# Patient Record
Sex: Female | Born: 1965 | Race: White | Hispanic: No | Smoking: Current every day smoker
Health system: Southern US, Community
[De-identification: ages and names within clinical notes are randomized; demographics above are authoritative.]

## PROBLEM LIST (undated history)

## (undated) DIAGNOSIS — R42 Dizziness and giddiness: Secondary | ICD-10-CM

## (undated) DIAGNOSIS — D649 Anemia, unspecified: Secondary | ICD-10-CM

## (undated) DIAGNOSIS — M549 Dorsalgia, unspecified: Secondary | ICD-10-CM

## (undated) DIAGNOSIS — K279 Peptic ulcer, site unspecified, unspecified as acute or chronic, without hemorrhage or perforation: Secondary | ICD-10-CM

## (undated) DIAGNOSIS — Z5189 Encounter for other specified aftercare: Secondary | ICD-10-CM

## (undated) DIAGNOSIS — R053 Chronic cough: Secondary | ICD-10-CM

## (undated) DIAGNOSIS — R0789 Other chest pain: Secondary | ICD-10-CM

## (undated) DIAGNOSIS — R05 Cough: Secondary | ICD-10-CM

## (undated) DIAGNOSIS — J40 Bronchitis, not specified as acute or chronic: Secondary | ICD-10-CM

## (undated) DIAGNOSIS — G8929 Other chronic pain: Secondary | ICD-10-CM

## (undated) HISTORY — PX: CHOLECYSTECTOMY: SHX55

## (undated) HISTORY — PX: ABDOMINAL HYSTERECTOMY: SHX81

## (undated) HISTORY — PX: OTHER SURGICAL HISTORY: SHX169

## (undated) HISTORY — PX: TUBAL LIGATION: SHX77

---

## 2001-10-20 ENCOUNTER — Emergency Department (HOSPITAL_COMMUNITY): Admission: EM | Admit: 2001-10-20 | Discharge: 2001-10-20 | Payer: Self-pay | Admitting: Internal Medicine

## 2001-11-28 ENCOUNTER — Ambulatory Visit (HOSPITAL_COMMUNITY): Admission: RE | Admit: 2001-11-28 | Discharge: 2001-11-28 | Payer: Self-pay | Admitting: Orthopaedic Surgery

## 2001-11-28 ENCOUNTER — Encounter: Payer: Self-pay | Admitting: Orthopaedic Surgery

## 2001-12-09 ENCOUNTER — Ambulatory Visit (HOSPITAL_COMMUNITY): Admission: RE | Admit: 2001-12-09 | Discharge: 2001-12-09 | Payer: Self-pay | Admitting: Orthopaedic Surgery

## 2001-12-09 ENCOUNTER — Encounter: Payer: Self-pay | Admitting: Orthopaedic Surgery

## 2002-06-06 ENCOUNTER — Emergency Department (HOSPITAL_COMMUNITY): Admission: EM | Admit: 2002-06-06 | Discharge: 2002-06-06 | Payer: Self-pay | Admitting: Emergency Medicine

## 2002-06-06 ENCOUNTER — Encounter: Payer: Self-pay | Admitting: Emergency Medicine

## 2002-07-10 ENCOUNTER — Emergency Department (HOSPITAL_COMMUNITY): Admission: EM | Admit: 2002-07-10 | Discharge: 2002-07-10 | Payer: Self-pay

## 2002-07-15 ENCOUNTER — Emergency Department (HOSPITAL_COMMUNITY): Admission: EM | Admit: 2002-07-15 | Discharge: 2002-07-15 | Payer: Self-pay | Admitting: *Deleted

## 2003-03-04 ENCOUNTER — Emergency Department (HOSPITAL_COMMUNITY): Admission: EM | Admit: 2003-03-04 | Discharge: 2003-03-04 | Payer: Self-pay | Admitting: Internal Medicine

## 2003-04-29 ENCOUNTER — Emergency Department (HOSPITAL_COMMUNITY): Admission: EM | Admit: 2003-04-29 | Discharge: 2003-04-29 | Payer: Self-pay | Admitting: Internal Medicine

## 2003-09-20 ENCOUNTER — Emergency Department (HOSPITAL_COMMUNITY): Admission: EM | Admit: 2003-09-20 | Discharge: 2003-09-20 | Payer: Self-pay | Admitting: Emergency Medicine

## 2003-12-14 ENCOUNTER — Emergency Department (HOSPITAL_COMMUNITY): Admission: EM | Admit: 2003-12-14 | Discharge: 2003-12-14 | Payer: Self-pay | Admitting: Emergency Medicine

## 2004-02-05 ENCOUNTER — Emergency Department (HOSPITAL_COMMUNITY): Admission: EM | Admit: 2004-02-05 | Discharge: 2004-02-05 | Payer: Self-pay | Admitting: Emergency Medicine

## 2005-05-30 ENCOUNTER — Emergency Department (HOSPITAL_COMMUNITY): Admission: EM | Admit: 2005-05-30 | Discharge: 2005-05-30 | Payer: Self-pay | Admitting: Emergency Medicine

## 2008-06-12 ENCOUNTER — Emergency Department (HOSPITAL_COMMUNITY): Admission: EM | Admit: 2008-06-12 | Discharge: 2008-06-12 | Payer: Self-pay | Admitting: Emergency Medicine

## 2008-06-26 ENCOUNTER — Emergency Department (HOSPITAL_COMMUNITY): Admission: EM | Admit: 2008-06-26 | Discharge: 2008-06-26 | Payer: Self-pay | Admitting: Emergency Medicine

## 2008-07-12 ENCOUNTER — Emergency Department (HOSPITAL_COMMUNITY): Admission: EM | Admit: 2008-07-12 | Discharge: 2008-07-12 | Payer: Self-pay | Admitting: Emergency Medicine

## 2008-10-15 ENCOUNTER — Emergency Department (HOSPITAL_COMMUNITY): Admission: EM | Admit: 2008-10-15 | Discharge: 2008-10-15 | Payer: Self-pay | Admitting: Emergency Medicine

## 2009-02-01 ENCOUNTER — Emergency Department (HOSPITAL_COMMUNITY): Admission: EM | Admit: 2009-02-01 | Discharge: 2009-02-01 | Payer: Self-pay | Admitting: Emergency Medicine

## 2009-05-16 ENCOUNTER — Emergency Department (HOSPITAL_COMMUNITY): Admission: EM | Admit: 2009-05-16 | Discharge: 2009-05-16 | Payer: Self-pay | Admitting: Emergency Medicine

## 2009-07-16 ENCOUNTER — Emergency Department (HOSPITAL_COMMUNITY): Admission: EM | Admit: 2009-07-16 | Discharge: 2009-07-16 | Payer: Self-pay | Admitting: Emergency Medicine

## 2009-08-29 ENCOUNTER — Emergency Department (HOSPITAL_COMMUNITY): Admission: EM | Admit: 2009-08-29 | Discharge: 2009-08-29 | Payer: Self-pay | Admitting: Emergency Medicine

## 2009-12-19 ENCOUNTER — Emergency Department (HOSPITAL_COMMUNITY): Admission: EM | Admit: 2009-12-19 | Discharge: 2009-12-19 | Payer: Self-pay | Admitting: Emergency Medicine

## 2010-01-25 ENCOUNTER — Emergency Department (HOSPITAL_COMMUNITY): Admission: EM | Admit: 2010-01-25 | Discharge: 2010-01-25 | Payer: Self-pay | Admitting: Emergency Medicine

## 2010-02-07 ENCOUNTER — Emergency Department (HOSPITAL_COMMUNITY): Admission: EM | Admit: 2010-02-07 | Discharge: 2010-02-07 | Payer: Self-pay | Admitting: Emergency Medicine

## 2010-03-07 ENCOUNTER — Emergency Department (HOSPITAL_COMMUNITY): Admission: EM | Admit: 2010-03-07 | Discharge: 2010-03-07 | Payer: Self-pay | Admitting: Emergency Medicine

## 2010-03-30 ENCOUNTER — Emergency Department (HOSPITAL_COMMUNITY): Admission: EM | Admit: 2010-03-30 | Discharge: 2010-03-30 | Payer: Self-pay | Admitting: Emergency Medicine

## 2010-04-11 ENCOUNTER — Emergency Department (HOSPITAL_COMMUNITY): Admission: EM | Admit: 2010-04-11 | Discharge: 2010-04-11 | Payer: Self-pay | Admitting: Emergency Medicine

## 2010-11-03 ENCOUNTER — Emergency Department (HOSPITAL_COMMUNITY)
Admission: EM | Admit: 2010-11-03 | Discharge: 2010-11-03 | Payer: Self-pay | Source: Home / Self Care | Admitting: Emergency Medicine

## 2010-11-20 ENCOUNTER — Emergency Department (HOSPITAL_COMMUNITY)
Admission: EM | Admit: 2010-11-20 | Discharge: 2010-11-20 | Payer: Self-pay | Source: Home / Self Care | Admitting: Emergency Medicine

## 2010-12-25 ENCOUNTER — Emergency Department (HOSPITAL_COMMUNITY)
Admission: EM | Admit: 2010-12-25 | Discharge: 2010-12-25 | Disposition: A | Discharge status: Home / Self Care | Payer: Medicaid Other | Attending: Emergency Medicine | Admitting: Emergency Medicine

## 2010-12-25 ENCOUNTER — Emergency Department (HOSPITAL_COMMUNITY): Payer: Medicaid Other

## 2010-12-25 DIAGNOSIS — R51 Headache: Secondary | ICD-10-CM | POA: Insufficient documentation

## 2010-12-25 DIAGNOSIS — M549 Dorsalgia, unspecified: Secondary | ICD-10-CM | POA: Insufficient documentation

## 2010-12-25 DIAGNOSIS — M542 Cervicalgia: Secondary | ICD-10-CM | POA: Insufficient documentation

## 2010-12-25 DIAGNOSIS — G8929 Other chronic pain: Secondary | ICD-10-CM | POA: Insufficient documentation

## 2011-01-17 ENCOUNTER — Emergency Department (HOSPITAL_COMMUNITY)
Admission: EM | Admit: 2011-01-17 | Discharge: 2011-01-17 | Disposition: A | Discharge status: Home / Self Care | Payer: Medicaid Other | Attending: Emergency Medicine | Admitting: Emergency Medicine

## 2011-01-17 DIAGNOSIS — R911 Solitary pulmonary nodule: Secondary | ICD-10-CM | POA: Insufficient documentation

## 2011-01-17 DIAGNOSIS — R109 Unspecified abdominal pain: Secondary | ICD-10-CM | POA: Insufficient documentation

## 2011-01-17 LAB — URINALYSIS, ROUTINE W REFLEX MICROSCOPIC
Hgb urine dipstick: NEGATIVE
Protein, ur: NEGATIVE mg/dL
Specific Gravity, Urine: 1.015 (ref 1.005–1.030)
Urine Glucose, Fasting: NEGATIVE mg/dL
Urobilinogen, UA: 0.2 mg/dL (ref 0.0–1.0)
pH: 5 (ref 5.0–8.0)

## 2011-02-06 LAB — URINALYSIS, ROUTINE W REFLEX MICROSCOPIC
Bilirubin Urine: NEGATIVE
Glucose, UA: NEGATIVE mg/dL
Ketones, ur: NEGATIVE mg/dL
Protein, ur: NEGATIVE mg/dL
Specific Gravity, Urine: >1.03 — ABNORMAL HIGH (ref 1.005–1.030)
Urobilinogen, UA: 0.2 mg/dL (ref 0.0–1.0)
pH: 5 (ref 5.0–8.0)

## 2011-02-06 LAB — URINE MICROSCOPIC-ADD ON

## 2011-02-06 LAB — WET PREP, GENITAL: Yeast Wet Prep HPF POC: NONE SEEN

## 2011-02-07 LAB — URINALYSIS, ROUTINE W REFLEX MICROSCOPIC
Bilirubin Urine: NEGATIVE
Bilirubin Urine: NEGATIVE
Glucose, UA: NEGATIVE mg/dL
Hgb urine dipstick: NEGATIVE
Ketones, ur: NEGATIVE mg/dL
Nitrite: NEGATIVE
Protein, ur: NEGATIVE mg/dL
Specific Gravity, Urine: 1.02 (ref 1.005–1.030)
pH: 5.5 (ref 5.0–8.0)

## 2011-02-07 LAB — BASIC METABOLIC PANEL
CO2: 25 mEq/L (ref 19–32)
Calcium: 9.5 mg/dL (ref 8.4–10.5)
Chloride: 109 mEq/L (ref 96–112)
GFR calc non Af Amer: >60 mL/min (ref >60)
Glucose, Bld: 94 mg/dL (ref 70–99)
Potassium: 4.3 mEq/L (ref 3.5–5.1)
Sodium: 141 mEq/L (ref 135–145)

## 2011-02-07 LAB — URINE MICROSCOPIC-ADD ON

## 2011-02-12 LAB — COMPREHENSIVE METABOLIC PANEL
AST: 28 U/L (ref 0–37)
Albumin: 3.7 g/dL (ref 3.5–5.2)
Alkaline Phosphatase: 126 U/L — ABNORMAL HIGH (ref 39–117)
BUN: 11 mg/dL (ref 6–23)
CO2: 24 mEq/L (ref 19–32)
Chloride: 110 mEq/L (ref 96–112)
Creatinine, Ser: 0.64 mg/dL (ref 0.4–1.2)
GFR calc Af Amer: >60 mL/min (ref >60)
GFR calc non Af Amer: >60 mL/min (ref >60)
Potassium: 4 mEq/L (ref 3.5–5.1)
Sodium: 141 mEq/L (ref 135–145)
Total Bilirubin: 0.2 mg/dL — ABNORMAL LOW (ref 0.3–1.2)
Total Protein: 6.6 g/dL (ref 6.0–8.3)

## 2011-02-12 LAB — DIFFERENTIAL
Basophils Absolute: 0.1 10*3/uL (ref 0.0–0.1)
Lymphs Abs: 2.6 10*3/uL (ref 0.7–4.0)
Monocytes Relative: 6 % (ref 3–12)
Neutrophils Relative %: 55 % (ref 43–77)

## 2011-02-12 LAB — URINALYSIS, ROUTINE W REFLEX MICROSCOPIC
Bilirubin Urine: NEGATIVE
Glucose, UA: NEGATIVE mg/dL
Protein, ur: NEGATIVE mg/dL
Specific Gravity, Urine: 1.015 (ref 1.005–1.030)
Urobilinogen, UA: 0.2 mg/dL (ref 0.0–1.0)
pH: 5.5 (ref 5.0–8.0)

## 2011-02-12 LAB — CBC
HCT: 31.8 % — ABNORMAL LOW (ref 36.0–46.0)
Hemoglobin: 10.5 g/dL — ABNORMAL LOW (ref 12.0–15.0)
MCHC: 33 g/dL (ref 30.0–36.0)
Platelets: 259 10*3/uL (ref 150–400)
RBC: 3.74 MIL/uL — ABNORMAL LOW (ref 3.87–5.11)

## 2011-02-12 LAB — LIPASE, BLOOD: Lipase: 32 U/L (ref 11–59)

## 2011-02-19 ENCOUNTER — Emergency Department (HOSPITAL_COMMUNITY)
Admission: EM | Admit: 2011-02-19 | Discharge: 2011-02-19 | Disposition: A | Discharge status: Home / Self Care | Payer: Medicaid Other | Attending: Emergency Medicine | Admitting: Emergency Medicine

## 2011-02-19 ENCOUNTER — Emergency Department (HOSPITAL_COMMUNITY): Payer: Medicaid Other

## 2011-02-19 DIAGNOSIS — R1031 Right lower quadrant pain: Secondary | ICD-10-CM | POA: Insufficient documentation

## 2011-02-19 DIAGNOSIS — M545 Low back pain, unspecified: Secondary | ICD-10-CM | POA: Insufficient documentation

## 2011-02-19 DIAGNOSIS — R748 Abnormal levels of other serum enzymes: Secondary | ICD-10-CM | POA: Insufficient documentation

## 2011-02-19 DIAGNOSIS — D649 Anemia, unspecified: Secondary | ICD-10-CM | POA: Insufficient documentation

## 2011-02-19 LAB — DIFFERENTIAL
Eosinophils Absolute: 0 10*3/uL (ref 0.0–0.7)
Eosinophils Relative: 1 % (ref 0–5)
Lymphs Abs: 1.3 10*3/uL (ref 0.7–4.0)
Monocytes Relative: 8 % (ref 3–12)
Neutrophils Relative %: 60 % (ref 43–77)

## 2011-02-19 LAB — URINALYSIS, ROUTINE W REFLEX MICROSCOPIC
Bilirubin Urine: NEGATIVE
Hgb urine dipstick: NEGATIVE
Nitrite: NEGATIVE
pH: 5.5 (ref 5.0–8.0)

## 2011-02-19 LAB — CBC
HCT: 30.3 % — ABNORMAL LOW (ref 36.0–46.0)
MCH: 23.8 pg — ABNORMAL LOW (ref 26.0–34.0)
MCV: 78.5 fL (ref 78.0–100.0)
Platelets: 321 10*3/uL (ref 150–400)
RBC: 3.86 MIL/uL — ABNORMAL LOW (ref 3.87–5.11)

## 2011-02-19 LAB — COMPREHENSIVE METABOLIC PANEL
ALT: 44 U/L — ABNORMAL HIGH (ref 0–35)
Alkaline Phosphatase: 95 U/L (ref 39–117)
BUN: 10 mg/dL (ref 6–23)
CO2: 22 mEq/L (ref 19–32)
Chloride: 108 mEq/L (ref 96–112)
GFR calc non Af Amer: >60 mL/min (ref >60)
Glucose, Bld: 88 mg/dL (ref 70–99)
Potassium: 4.3 mEq/L (ref 3.5–5.1)
Total Bilirubin: 0.3 mg/dL (ref 0.3–1.2)
Total Protein: 6.4 g/dL (ref 6.0–8.3)

## 2011-02-23 LAB — URINALYSIS, ROUTINE W REFLEX MICROSCOPIC
Ketones, ur: NEGATIVE mg/dL
Protein, ur: NEGATIVE mg/dL
Urobilinogen, UA: 0.2 mg/dL (ref 0.0–1.0)

## 2011-02-25 LAB — WET PREP, GENITAL

## 2011-02-25 LAB — URINE CULTURE: Culture: NO GROWTH

## 2011-02-25 LAB — URINALYSIS, ROUTINE W REFLEX MICROSCOPIC
Bilirubin Urine: NEGATIVE
Ketones, ur: NEGATIVE mg/dL
Nitrite: NEGATIVE
Protein, ur: NEGATIVE mg/dL

## 2011-02-25 LAB — URINE MICROSCOPIC-ADD ON

## 2011-02-25 LAB — GC/CHLAMYDIA PROBE AMP, GENITAL: Chlamydia, DNA Probe: NEGATIVE

## 2011-02-27 LAB — URINE MICROSCOPIC-ADD ON

## 2011-02-27 LAB — URINALYSIS, ROUTINE W REFLEX MICROSCOPIC
Bilirubin Urine: NEGATIVE
Nitrite: NEGATIVE
Specific Gravity, Urine: 1.01 (ref 1.005–1.030)
Urobilinogen, UA: 0.2 mg/dL (ref 0.0–1.0)
pH: 5.5 (ref 5.0–8.0)

## 2011-02-27 LAB — URINE CULTURE

## 2011-05-08 ENCOUNTER — Emergency Department (HOSPITAL_COMMUNITY): Payer: Medicaid Other

## 2011-05-08 ENCOUNTER — Emergency Department (HOSPITAL_COMMUNITY)
Admission: EM | Admit: 2011-05-08 | Discharge: 2011-05-08 | Disposition: A | Discharge status: Home / Self Care | Payer: Medicaid Other | Attending: Emergency Medicine | Admitting: Emergency Medicine

## 2011-05-08 DIAGNOSIS — M542 Cervicalgia: Secondary | ICD-10-CM | POA: Insufficient documentation

## 2011-05-08 DIAGNOSIS — W108XXA Fall (on) (from) other stairs and steps, initial encounter: Secondary | ICD-10-CM | POA: Insufficient documentation

## 2011-05-08 DIAGNOSIS — G8929 Other chronic pain: Secondary | ICD-10-CM | POA: Insufficient documentation

## 2011-05-08 DIAGNOSIS — M549 Dorsalgia, unspecified: Secondary | ICD-10-CM | POA: Insufficient documentation

## 2011-10-02 ENCOUNTER — Emergency Department (HOSPITAL_COMMUNITY)
Admission: EM | Admit: 2011-10-02 | Discharge: 2011-10-03 | Disposition: A | Discharge status: Home / Self Care | Payer: Medicaid Other | Attending: Emergency Medicine | Admitting: Emergency Medicine

## 2011-10-02 DIAGNOSIS — S1093XA Contusion of unspecified part of neck, initial encounter: Secondary | ICD-10-CM | POA: Insufficient documentation

## 2011-10-02 DIAGNOSIS — S63501A Unspecified sprain of right wrist, initial encounter: Secondary | ICD-10-CM

## 2011-10-02 DIAGNOSIS — S0003XA Contusion of scalp, initial encounter: Secondary | ICD-10-CM | POA: Insufficient documentation

## 2011-10-02 DIAGNOSIS — W010XXA Fall on same level from slipping, tripping and stumbling without subsequent striking against object, initial encounter: Secondary | ICD-10-CM | POA: Insufficient documentation

## 2011-10-02 DIAGNOSIS — S0093XA Contusion of unspecified part of head, initial encounter: Secondary | ICD-10-CM

## 2011-10-02 DIAGNOSIS — F17200 Nicotine dependence, unspecified, uncomplicated: Secondary | ICD-10-CM | POA: Insufficient documentation

## 2011-10-02 DIAGNOSIS — S63509A Unspecified sprain of unspecified wrist, initial encounter: Secondary | ICD-10-CM | POA: Insufficient documentation

## 2011-10-02 DIAGNOSIS — W19XXXA Unspecified fall, initial encounter: Secondary | ICD-10-CM

## 2011-10-02 NOTE — ED Provider Notes (Signed)
History   This chart was scribed for Joanna Hutching, MD by Clarita Crane. The patient was seen in room APA03/APA03 and the patient's care was started at 11:23PM.   CSN: [id number] Arrival date & time: 10/02/2011 10:27 PM   First MD Initiated Contact with Patient 10/02/11 2312      Chief Complaint  Patient presents with  . Fall  . Facial Laceration   HPI Joanna Stephens is a 45 y.o. female who presents to the Emergency Department complaining of mild laceration/gouge to midline of forehead sustained several hours ago after falling while walking home with associated mild to moderate HA. Patient admits to having consumed EtOH (2 beers) prior to sustaining laceration with fall. Denies LOC, syncope, vision change. Patient also c/o right wrist pain to dorsal aspect of wrist sustained last night following a fall and persistent since. Patient reports she was evaluated in ED this morning for right wrist pain with negative imaging results.   History reviewed. No pertinent past medical history.  Past Surgical History  Procedure Date  . Abdominal hysterectomy     No family history on file.  History  Substance Use Topics  . Smoking status: Current Everyday Smoker  . Smokeless tobacco: Not on file  . Alcohol Use: Yes    OB History    Grav Para Term Preterm Abortions TAB SAB Ect Mult Living                  Review of Systems 10 Systems reviewed and are negative for acute change except as noted in the HPI.  Allergies  Review of patient's allergies indicates no known allergies.  Home Medications  No current outpatient prescriptions on file.  BP 109/53  Pulse 94  Temp(Src) 98.5 F (36.9 C) (Oral)  Resp 15  Ht 5\' 3"  (1.6 m)  Wt 110 lb (49.896 kg)  BMI 19.49 kg/m2  SpO2 98%  Physical Exam  Nursing note and vitals reviewed. Constitutional: She is oriented to person, place, and time. She appears well-developed and well-nourished. No distress.  HENT:  Head: Normocephalic and  atraumatic.       .5cm gouge to midline of forehead.   Eyes: EOM are normal. Pupils are equal, round, and reactive to light.  Neck: Neck supple. No tracheal deviation present.  Cardiovascular: Normal rate.   Pulmonary/Chest: Effort normal. No respiratory distress.  Abdominal: She exhibits no distension.  Musculoskeletal: Normal range of motion. She exhibits no edema.       Tenderness to dorsal aspect of right wrist.  Neurological: She is alert and oriented to person, place, and time. No sensory deficit.  Skin: Skin is warm and dry.  Psychiatric: She has a normal mood and affect. Her behavior is normal.    ED Course  Procedures (including critical care time)  DIAGNOSTIC STUDIES: Oxygen Saturation is 98% on room air, normal by my interpretation.    COORDINATION OF CARE:    Labs Reviewed - No data to display No results found.   No diagnosis found.  Results for orders placed during the hospital encounter of 02/19/11  DIFFERENTIAL      Component Value Range   Neutrophils Relative 60  43 - 77 (%)   Neutro Abs 2.5  1.7 - 7.7 (K/uL)   Lymphocytes Relative 31  12 - 46 (%)   Lymphs Abs 1.3  0.7 - 4.0 (K/uL)   Monocytes Relative 8  3 - 12 (%)   Monocytes Absolute 0.3  0.1 - 1.0 (K/uL)  Eosinophils Relative 1  0 - 5 (%)   Eosinophils Absolute 0.0  0.0 - 0.7 (K/uL)   Basophils Relative 1  0 - 1 (%)   Basophils Absolute 0.0  0.0 - 0.1 (K/uL)  CBC      Component Value Range   WBC 4.2  4.0 - 10.5 (K/uL)   RBC 3.86 (*) 3.87 - 5.11 (MIL/uL)   Hemoglobin 9.2 (*) 12.0 - 15.0 (g/dL)   HCT 56.2 (*) 13.0 - 46.0 (%)   MCV 78.5  78.0 - 100.0 (fL)   MCH 23.8 (*) 26.0 - 34.0 (pg)   MCHC 30.4  30.0 - 36.0 (g/dL)   RDW 86.5 (*) 78.4 - 15.5 (%)   Platelets 321  150 - 400 (K/uL)  URINALYSIS, ROUTINE W REFLEX MICROSCOPIC      Component Value Range   Color, Urine YELLOW  YELLOW    Appearance HAZY (*) CLEAR    Specific Gravity, Urine >1.030 (*) 1.005 - 1.030    pH 5.5  5.0 - 8.0     Glucose, UA NEGATIVE  NEGATIVE (mg/dL)   Hgb urine dipstick NEGATIVE  NEGATIVE    Bilirubin Urine NEGATIVE  NEGATIVE    Ketones, ur TRACE (*) NEGATIVE (mg/dL)   Protein, ur NEGATIVE  NEGATIVE (mg/dL)   Urobilinogen, UA 0.2  0.0 - 1.0 (mg/dL)   Nitrite NEGATIVE  NEGATIVE    Leukocytes, UA    NEGATIVE    Value: NEGATIVE MICROSCOPIC NOT DONE ON URINES WITH NEGATIVE PROTEIN, BLOOD, LEUKOCYTES, NITRITE, OR GLUCOSE <1000 mg/dL.  COMPREHENSIVE METABOLIC PANEL      Component Value Range   Sodium 137  135 - 145 (mEq/L)   Potassium 4.3  3.5 - 5.1 (mEq/L)   Chloride 108  96 - 112 (mEq/L)   CO2 22  19 - 32 (mEq/L)   Glucose, Bld 88  70 - 99 (mg/dL)   BUN 10  6 - 23 (mg/dL)   Creatinine, Ser 6.96  0.4 - 1.2 (mg/dL)   Calcium 9.3  8.4 - 29.5 (mg/dL)   Total Protein 6.4  6.0 - 8.3 (g/dL)   Albumin 3.8  3.5 - 5.2 (g/dL)   AST 284 (*) 0 - 37 (U/L)   ALT 44 (*) 0 - 35 (U/L)   Alkaline Phosphatase 95  39 - 117 (U/L)   Total Bilirubin 0.3  0.3 - 1.2 (mg/dL)   GFR calc non Af Amer >60  >60 (mL/min)   GFR calc Af Amer    >60 (mL/min)   Value: >60            The eGFR has been calculated     using the MDRD equation.     This calculation has not been     validated in all clinical     situations.     eGFR's persistently     <60 mL/min signify     possible Chronic Kidney Disease.    MDM  Patient had accidental fall.  No loss of consciousness.  Superficial scrape on for head. X. ray of right wrist and CT head negative.  No neuro deficits     I personally performed the services described in this documentation, which was scribed in my presence. The recorded information has been reviewed and considered.    Joanna Hutching, MD 10/03/11 [phone]

## 2011-10-02 NOTE — ED Notes (Signed)
Pt walking home tonight and fell onto her forehead. Pt smells of ETOH. Laceration to forehead present. C/o headache. Bleeding controlled. Pt recently seen yesterday at [facility] for fall and was told "my wrist is sprained" pt alert and oriented at this time. Denies loc at time of fall.

## 2011-10-02 NOTE — ED Notes (Signed)
Into room to assess patient. Patient resting in bed on back. Is alert and oriented x 4. Denies losing consciousness when she fell on pavement this evening. Pupils 4 mm bilaterally, sluggish and glazed look. Admits to drinking this evening. Small button size abrasion to upper middle forehead. States she has 10\10 headache, aching in nature at site. Denies any fuzzy vision or blindness. Bleeding controlled at site. Denies any neck pain. Equal chest rise and fall. Clear lung sounds. Ace wrap to right wrist from previous injury yesterday. Denies any needs at this time. Call bell within reach. Awaiting MD evaluation.

## 2011-10-02 NOTE — ED Notes (Signed)
MD at bedside to evaluate. Remains resting in bed on back. No distress. Denies needs. Equal chest rise and fall. Call bell within reach. Bed in low position and locked with side rails up.

## 2011-10-03 ENCOUNTER — Emergency Department (HOSPITAL_COMMUNITY): Payer: Medicaid Other

## 2011-10-03 NOTE — ED Notes (Signed)
Patient unable to find ride home. Keeps falling asleep when attempting to call for a ride. Consulted with charge nurse. Will allow patient to sleep off etoh and find ride in morning.

## 2011-10-03 NOTE — ED Notes (Signed)
Remains resting in bed on left side with eyes closed and lights off. No distress. Equal chest rise and fall. Call bell within reach. Bed in low position and locked. Will continue to monitor.

## 2011-10-03 NOTE — ED Notes (Signed)
Resting with eyes closed and lights off. Resting on left side. No distress. Equal chest rise and fall. Call bell within reach. Will continue to monitor.

## 2011-10-03 NOTE — ED Notes (Signed)
Remains resting with eyes closed and lights off. Equal chest rise and fall. No distress. Breathing 20x\minute, regular, nonlabored. Call bell within reach. Will continue to monitor.

## 2011-10-03 NOTE — ED Notes (Signed)
Resting with eyes closed and lights off. Equal chest rise and fall at 16x\minute, regular and equal. No distress. Will continue to monitor.

## 2011-10-03 NOTE — ED Notes (Signed)
Patient to CT via stretcher.

## 2011-10-03 NOTE — ED Notes (Signed)
Remains resting comfortably. Call bell within reach. Equal chest rise and fall. No distress.

## 2011-10-03 NOTE — ED Notes (Signed)
Remains resting in bed on back. Eyes closed. No facial grimaces. Equal chest rise and fall. No distress. Call bell within reach. Bed in low position and locked.

## 2011-10-03 NOTE — ED Notes (Signed)
Splint applied to right wrist. Tolerated well. Able to wiggle fingers. Brisk cap refill. Patient calling for ride home at this time.

## 2011-10-03 NOTE — ED Notes (Signed)
Patient awakened. Alert and oriented x 4. Given phone to call for a ride. Explained discharge papers. Verbalized understanding.

## 2011-10-03 NOTE — ED Notes (Signed)
Patient resting with eyes closed and lights off. Equal chest rise and fall. No distress. Bed in low position and locked with side rails up. Will continue to monitor.

## 2011-10-03 NOTE — ED Notes (Signed)
MD at bedside. 

## 2011-10-03 NOTE — ED Notes (Signed)
Remains resting with eyes closed. No distress. Equal chest rise and fall. Call bell within reach. Bed in low position and locked. No facial grimacing.

## 2012-03-29 ENCOUNTER — Encounter (HOSPITAL_COMMUNITY): Payer: Self-pay | Admitting: Emergency Medicine

## 2012-03-29 ENCOUNTER — Emergency Department (HOSPITAL_COMMUNITY)
Admission: EM | Admit: 2012-03-29 | Discharge: 2012-03-29 | Disposition: A | Discharge status: Home / Self Care | Payer: Medicaid Other | Attending: Emergency Medicine | Admitting: Emergency Medicine

## 2012-03-29 DIAGNOSIS — X58XXXA Exposure to other specified factors, initial encounter: Secondary | ICD-10-CM | POA: Insufficient documentation

## 2012-03-29 DIAGNOSIS — S39012A Strain of muscle, fascia and tendon of lower back, initial encounter: Secondary | ICD-10-CM

## 2012-03-29 DIAGNOSIS — M5380 Other specified dorsopathies, site unspecified: Secondary | ICD-10-CM | POA: Insufficient documentation

## 2012-03-29 DIAGNOSIS — S339XXA Sprain of unspecified parts of lumbar spine and pelvis, initial encounter: Secondary | ICD-10-CM | POA: Insufficient documentation

## 2012-03-29 DIAGNOSIS — M5430 Sciatica, unspecified side: Secondary | ICD-10-CM | POA: Insufficient documentation

## 2012-03-29 MED ORDER — IBUPROFEN 800 MG PO TABS: 800.0000 mg | ORAL_TABLET | Freq: Once | ORAL | Status: DC

## 2012-03-29 MED ORDER — OXYCODONE-ACETAMINOPHEN 5-325 MG PO TABS: 2.0000 | ORAL_TABLET | Freq: Once | ORAL | Status: DC

## 2012-03-29 MED ORDER — IBUPROFEN 800 MG PO TABS
800.0000 mg | ORAL_TABLET | Freq: Once | ORAL | Status: AC
  Administered 2012-03-29: 800 mg via ORAL
  Filled 2012-03-29: qty 1

## 2012-03-29 MED ORDER — NAPROXEN 500 MG PO TABS: 500.0000 mg | ORAL_TABLET | Freq: Two times a day (BID) | ORAL | Status: DC

## 2012-03-29 MED ORDER — OXYCODONE-ACETAMINOPHEN 5-325 MG PO TABS: 1.0000 | ORAL_TABLET | Freq: Four times a day (QID) | ORAL | Status: AC | PRN

## 2012-03-29 MED ORDER — OXYCODONE-ACETAMINOPHEN 5-325 MG PO TABS
2.0000 | ORAL_TABLET | Freq: Once | ORAL | Status: AC
  Administered 2012-03-29: 2 via ORAL
  Filled 2012-03-29: qty 2

## 2012-03-29 NOTE — ED Provider Notes (Signed)
History     CSN: [id number]  Arrival date & time 03/29/12  4098   First MD Initiated Contact with Patient 03/29/12 [phone]      Chief Complaint  Patient presents with  . Back Pain  . Leg Pain    (Consider location/radiation/quality/duration/timing/severity/associated sxs/prior treatment) HPI Comments: No known injury.  Was unable to sleep last night due to pain radiating down her LLE.  Denies loss of bowel or bladder function.  No saddle anesthesia or urine retention  Patient is a 46 y.o. female presenting with back pain. The history is provided by the patient. No language interpreter was used.  Back Pain  This is a new problem. The current episode started 2 days ago. The problem occurs constantly. The problem has been gradually worsening. The pain is associated with no known injury. The pain is present in the lumbar spine. The quality of the pain is described as aching. The pain radiates to the left thigh and left knee. The pain is severe. Associated symptoms include leg pain. Pertinent negatives include no chest pain, no fever, no numbness, no headaches, no abdominal pain, no bowel incontinence, no perianal numbness, no bladder incontinence, no dysuria, no paresthesias, no tingling and no weakness. She has tried analgesics for the symptoms. The treatment provided no relief.    History reviewed. No pertinent past medical history.  Past Surgical History  Procedure Date  . Abdominal hysterectomy     History reviewed. No pertinent family history.  History  Substance Use Topics  . Smoking status: Current Everyday Smoker  . Smokeless tobacco: Not on file  . Alcohol Use: Yes    OB History    Grav Para Term Preterm Abortions TAB SAB Ect Mult Living                  Review of Systems  Constitutional: Negative for fever, chills, activity change, appetite change and fatigue.  HENT: Negative for congestion, sore throat, rhinorrhea, neck pain and neck stiffness.   Respiratory:  Negative for cough and shortness of breath.   Cardiovascular: Negative for chest pain and palpitations.  Gastrointestinal: Negative for nausea, vomiting, abdominal pain, constipation and bowel incontinence.  Genitourinary: Negative for bladder incontinence, dysuria, urgency, frequency and flank pain.  Musculoskeletal: Positive for back pain. Negative for myalgias and arthralgias.  Neurological: Negative for dizziness, tingling, weakness, light-headedness, numbness, headaches and paresthesias.  All other systems reviewed and are negative.    Allergies  Review of patient's allergies indicates no known allergies.  Home Medications   Current Outpatient Rx  Name Route Sig Dispense Refill  . NAPROXEN 500 MG PO TABS Oral Take 1 tablet (500 mg total) by mouth 2 (two) times daily. 30 tablet 0  . OXYCODONE-ACETAMINOPHEN 5-325 MG PO TABS Oral Take 1-2 tablets by mouth every 6 (six) hours as needed for pain. 12 tablet 0    BP 117/65  Pulse 73  Temp(Src) 98 F (36.7 C) (Oral)  Resp 20  Ht 5\' 3"  (1.6 m)  Wt 105 lb (47.628 kg)  BMI 18.60 kg/m2  SpO2 98%  Physical Exam  Nursing note and vitals reviewed. Constitutional: She is oriented to person, place, and time. She appears well-developed and well-nourished. No distress.       Uncomfortable in appearance  HENT:  Head: Normocephalic and atraumatic.  Mouth/Throat: Oropharynx is clear and moist.  Eyes: Conjunctivae and EOM are normal. Pupils are equal, round, and reactive to light.  Neck: Normal range of motion. Neck supple.  Cardiovascular: Normal rate, regular rhythm, normal heart sounds and intact distal pulses.  Exam reveals no gallop and no friction rub.   No murmur heard. Pulmonary/Chest: Effort normal and breath sounds normal. No respiratory distress. She exhibits no tenderness.  Abdominal: Soft. Bowel sounds are normal. There is no tenderness. There is no rebound and no guarding.  Musculoskeletal:       Lumbar back: She exhibits  tenderness, pain and spasm. She exhibits no bony tenderness.       Back:  Neurological: She is alert and oriented to person, place, and time. She has normal reflexes. No sensory deficit.  Skin: Skin is warm and dry. No rash noted.    ED Course  Procedures (including critical care time)  Labs Reviewed - No data to display No results found.   1. Sciatica   2. Lumbosacral strain       MDM  Pain secondary to sciatica due to lumbosacral strain. She has no concerning symptoms such as loss of bowel or bladder function, saddle anesthesia to suggest cauda equina. She has no risk factors for epidural abscess. She has no midline tenderness. She'll be discharged home with pain medication anti-inflammatory medication. She's instructed to apply ice for 2 days and heat thereafter. Instructed to remain active. There is no indication for imaging at this time. Instructed to followup with her primary care physician. Strict return precautions were provided         Dayton Bailiff, MD 03/29/12 [phone]

## 2012-03-29 NOTE — ED Notes (Signed)
Pt states she woke up with pain to lower back, radiating down left leg. NAD

## 2012-03-29 NOTE — Discharge Instructions (Signed)

## 2012-03-29 NOTE — ED Notes (Signed)
Pt c/o lower back pain that radiates down the left leg x 2 days.

## 2012-06-01 ENCOUNTER — Encounter (HOSPITAL_COMMUNITY): Payer: Self-pay

## 2012-06-01 ENCOUNTER — Emergency Department (HOSPITAL_COMMUNITY)
Admission: EM | Admit: 2012-06-01 | Discharge: 2012-06-01 | Disposition: A | Discharge status: Home / Self Care | Payer: Medicaid Other | Attending: Emergency Medicine | Admitting: Emergency Medicine

## 2012-06-01 ENCOUNTER — Other Ambulatory Visit: Payer: Self-pay

## 2012-06-01 ENCOUNTER — Emergency Department (HOSPITAL_COMMUNITY): Payer: Medicaid Other

## 2012-06-01 DIAGNOSIS — F17200 Nicotine dependence, unspecified, uncomplicated: Secondary | ICD-10-CM | POA: Insufficient documentation

## 2012-06-01 DIAGNOSIS — R079 Chest pain, unspecified: Secondary | ICD-10-CM

## 2012-06-01 DIAGNOSIS — M5430 Sciatica, unspecified side: Secondary | ICD-10-CM | POA: Insufficient documentation

## 2012-06-01 LAB — CBC WITH DIFFERENTIAL/PLATELET
Basophils Absolute: 0 10*3/uL (ref 0.0–0.1)
Eosinophils Relative: 2 % (ref 0–5)
HCT: 34.9 % — ABNORMAL LOW (ref 36.0–46.0)
Hemoglobin: 11.2 g/dL — ABNORMAL LOW (ref 12.0–15.0)
Lymphocytes Relative: 32 % (ref 12–46)
Lymphs Abs: 1.7 10*3/uL (ref 0.7–4.0)
MCV: 92.1 fL (ref 78.0–100.0)
Monocytes Absolute: 0.5 10*3/uL (ref 0.1–1.0)
Monocytes Relative: 9 % (ref 3–12)
Neutro Abs: 3 10*3/uL (ref 1.7–7.7)
RBC: 3.79 MIL/uL — ABNORMAL LOW (ref 3.87–5.11)
WBC: 5.3 10*3/uL (ref 4.0–10.5)

## 2012-06-01 LAB — TROPONIN I: Troponin I: <0.3 ng/mL (ref <0.30)

## 2012-06-01 LAB — BASIC METABOLIC PANEL
BUN: 8 mg/dL (ref 6–23)
CO2: 24 mEq/L (ref 19–32)
Calcium: 9.5 mg/dL (ref 8.4–10.5)
Chloride: 108 mEq/L (ref 96–112)
Creatinine, Ser: 0.62 mg/dL (ref 0.50–1.10)
Glucose, Bld: 104 mg/dL — ABNORMAL HIGH (ref 70–99)

## 2012-06-01 MED ORDER — NAPROXEN 500 MG PO TABS: 500.0000 mg | ORAL_TABLET | Freq: Two times a day (BID) | ORAL | Status: DC

## 2012-06-01 MED ORDER — HYDROCODONE-ACETAMINOPHEN 5-325 MG PO TABS: 1.0000 | ORAL_TABLET | Freq: Four times a day (QID) | ORAL | Status: AC | PRN

## 2012-06-01 MED ORDER — HYDROCODONE-ACETAMINOPHEN 5-325 MG PO TABS
1.0000 | ORAL_TABLET | Freq: Once | ORAL | Status: AC
  Administered 2012-06-01: 1 via ORAL
  Filled 2012-06-01: qty 1

## 2012-06-01 MED ORDER — CYCLOBENZAPRINE HCL 10 MG PO TABS: 10.0000 mg | ORAL_TABLET | Freq: Two times a day (BID) | ORAL | Status: AC | PRN

## 2012-06-01 MED ORDER — IBUPROFEN 800 MG PO TABS
800.0000 mg | ORAL_TABLET | Freq: Once | ORAL | Status: AC
  Administered 2012-06-01: 800 mg via ORAL
  Filled 2012-06-01: qty 1

## 2012-06-01 NOTE — ED Provider Notes (Signed)
History  This chart was scribed for Joanna Jakes, MD by Ladona Ridgel Day. This patient was seen in room APA12/APA12 and the patient's care was started at 0839.  CSN: [id number]  Arrival date & time 06/01/12  7829   First MD Initiated Contact with Patient 06/01/12 [phone]      Chief Complaint  Patient presents with  . Leg Pain    The history is provided by the patient. No language interpreter was used.   Joanna Stephens is a 46 y.o. female who presents to the Emergency Department with history of sciatica complaining of constant, 10/10 left leg pain. She states her pain/numbness originates from her left back and radiates down her left leg to the top of her left foot where she also feels numbness. Her symptoms are similar to an episode occuring 2 months ago and have been ongoing the past 3 months. She tried taking Tylenol which has not resolved improved her pain symptoms. She denies any weakness in her legs, fever, abdominal pain, nausea, emesis, diarrhea, urinary symptoms, neck pain. She also complains of a sore throat past couple of days, congestion, and mild chest pain for a month. She is a current everyday smoker.  She has no PCP   History reviewed. No pertinent past medical history.  Past Surgical History  Procedure Date  . Abdominal hysterectomy     No family history on file.  History  Substance Use Topics  . Smoking status: Current Everyday Smoker    Types: Cigarettes  . Smokeless tobacco: Not on file  . Alcohol Use: Yes    OB History    Grav Para Term Preterm Abortions TAB SAB Ect Mult Living                  Review of Systems 10 Systems reviewed and are negative for acute change except as noted in the HPI. Allergies  Review of patient's allergies indicates no known allergies.  Home Medications   Current Outpatient Rx  Name Route Sig Dispense Refill  . CYCLOBENZAPRINE HCL 10 MG PO TABS Oral Take 1 tablet (10 mg total) by mouth 2 (two) times daily as needed for  muscle spasms. 20 tablet 0  . HYDROCODONE-ACETAMINOPHEN 5-325 MG PO TABS Oral Take 1-2 tablets by mouth every 6 (six) hours as needed for pain. 14 tablet 0  . NAPROXEN 500 MG PO TABS Oral Take 1 tablet (500 mg total) by mouth 2 (two) times daily. 30 tablet 0  . NAPROXEN 500 MG PO TABS Oral Take 1 tablet (500 mg total) by mouth 2 (two) times daily. 14 tablet 0    Triage Vitals: BP 101/64  Pulse 74  Temp 97.5 F (36.4 C) (Oral)  Resp 18  Ht 5\' 3"  (1.6 m)  Wt 99 lb 5 oz (45.048 kg)  BMI 17.59 kg/m2  SpO2 100%  Physical Exam  Nursing note and vitals reviewed. Constitutional: She is oriented to person, place, and time. She appears well-developed.       She is cachectic.   HENT:  Head: Normocephalic and atraumatic.  Eyes: Conjunctivae and EOM are normal.  Neck: Normal range of motion.  Cardiovascular: Normal rate, regular rhythm and normal heart sounds.   No murmur heard. Pulmonary/Chest: Effort normal and breath sounds normal. No respiratory distress. She has no wheezes.  Abdominal: Soft. Bowel sounds are normal. She exhibits no distension. There is no tenderness. There is no rebound and no guarding.  Musculoskeletal: Normal range of motion.  Neurological: She  is alert and oriented to person, place, and time. No cranial nerve deficit. Coordination normal.       Decreased sensation of her left lateral leg.  Skin: Skin is warm and dry.    ED Course  Procedures (including critical care time) DIAGNOSTIC STUDIES: Oxygen Saturation is 100% on room air, normal by my interpretation.    COORDINATION OF CARE: At 71 Discussed treatment plan with patient which includes pain medicines, cardiac workup, blood work and referral to local spine specialist for further treatment/evaluation. Patient agrees.  Results for orders placed during the hospital encounter of 06/01/12  CBC WITH DIFFERENTIAL      Component Value Range   WBC 5.3  4.0 - 10.5 K/uL   RBC 3.79 (*) 3.87 - 5.11 MIL/uL    Hemoglobin 11.2 (*) 12.0 - 15.0 g/dL   HCT 32.4 (*) 40.1 - 02.7 %   MCV 92.1  78.0 - 100.0 fL   MCH 29.6  26.0 - 34.0 pg   MCHC 32.1  30.0 - 36.0 g/dL   RDW 25.3 (*) 66.4 - 40.3 %   Platelets 247  150 - 400 K/uL   Neutrophils Relative 57  43 - 77 %   Neutro Abs 3.0  1.7 - 7.7 K/uL   Lymphocytes Relative 32  12 - 46 %   Lymphs Abs 1.7  0.7 - 4.0 K/uL   Monocytes Relative 9  3 - 12 %   Monocytes Absolute 0.5  0.1 - 1.0 K/uL   Eosinophils Relative 2  0 - 5 %   Eosinophils Absolute 0.1  0.0 - 0.7 K/uL   Basophils Relative 1  0 - 1 %   Basophils Absolute 0.0  0.0 - 0.1 K/uL  BASIC METABOLIC PANEL      Component Value Range   Sodium 141  135 - 145 mEq/L   Potassium 3.7  3.5 - 5.1 mEq/L   Chloride 108  96 - 112 mEq/L   CO2 24  19 - 32 mEq/L   Glucose, Bld 104 (*) 70 - 99 mg/dL   BUN 8  6 - 23 mg/dL   Creatinine, Ser 4.74  0.50 - 1.10 mg/dL   Calcium 9.5  8.4 - 25.9 mg/dL   GFR calc non Af Amer >90  >90 mL/min   GFR calc Af Amer >90  >90 mL/min  TROPONIN I      Component Value Range   Troponin I <0.30  <0.30 ng/mL   Labs Reviewed  CBC WITH DIFFERENTIAL - Abnormal; Notable for the following:    RBC 3.79 (*)     Hemoglobin 11.2 (*)     HCT 34.9 (*)     RDW 17.4 (*)     All other components within normal limits  BASIC METABOLIC PANEL - Abnormal; Notable for the following:    Glucose, Bld 104 (*)     All other components within normal limits  TROPONIN I   Dg Chest 1 View  06/01/2012  *RADIOLOGY REPORT*  Clinical Data: History of leg pain and evaluate chest with nipple markers.  CHEST - 1 VIEW  Comparison: 06/01/2012  Findings: Single view of the chest was obtained with bilateral nipple markers.  The density in the right lower chest on the prior examination corresponds with the location of the nipple marker on this examination.  Again noted are slightly prominent lung markings without focal disease or edema.  Heart size is normal.  Surgical changes near the GE junction.  IMPRESSION:  No  acute chest findings.  Original Report Authenticated By: Richarda Overlie, M.D.   Dg Chest 2 View  06/01/2012  *RADIOLOGY REPORT*  Clinical Data: Leg pain.  Smoker.  CHEST - 2 VIEW  Comparison: 11/20/2010  Findings: Surgical clips in the region of the gastroesophageal junction. Midline trachea.  Normal heart size and mediastinal contours. No pleural effusion or pneumothorax.  Mild interstitial thickening is likely related to chronic bronchitis or smoking. Subtle increased density projecting over the right lung base on the frontal is favored to be due to a nipple shadow.  Cholecystectomy.  IMPRESSION:  1. No acute cardiopulmonary disease. 2. Peribronchial thickening which may relate to chronic bronchitis or smoking. 3.  Probable nipple shadow projecting over the right lung base. Repeat frontal film with nipple markers could confirm.  Original Report Authenticated By: Consuello Bossier, M.D.    Date: 06/01/2012  Rate: 57  Rhythm: sinus bradycardia  QRS Axis: normal  Intervals: normal  ST/T Wave abnormalities: normal  Conduction Disutrbances:none  Narrative Interpretation:   Old EKG Reviewed: none available    1. Sciatica   2. Chest pain       MDM  Patient with 2 complaints one is of long-standing left-sided back pain with sciatica having problems with that since April. The other complaint was 2 months worth of chest pain that's on and off but worse last few days. Workup for that was negative chest x-ray negative for pneumonia or pneumothorax troponin is negative EKG without acute changes. Will refer patient to the spine clinic for followup of her back. Will treat with pain medicines and anti-inflammatory medicines and muscle relaxers. With the sciatica patient does have some numbness in her left foot but does not have any significant weakness. No other significant focal findings.   I personally performed the services described in this documentation, which was scribed in my presence. The recorded  information has been reviewed and considered.         Joanna Jakes, MD 06/01/12 1116

## 2012-06-01 NOTE — ED Notes (Signed)
Left leg pain for 2 days

## 2012-06-01 NOTE — ED Notes (Signed)
Pt returned from xray

## 2012-06-01 NOTE — ED Notes (Signed)
Advised patient to get dressed, she would be discharged soon.

## 2012-06-01 NOTE — ED Notes (Signed)
Pt states that she came into er today for her chronic lower back pain that radiates down her left leg, denies any injury. Pt also states that she was experiencing sharp chest pain that would radiate to left arm two days ago, denies any chest pain now.

## 2012-06-15 ENCOUNTER — Emergency Department (HOSPITAL_COMMUNITY): Payer: Medicaid Other

## 2012-06-15 ENCOUNTER — Emergency Department (HOSPITAL_COMMUNITY)
Admission: EM | Admit: 2012-06-15 | Discharge: 2012-06-15 | Disposition: A | Discharge status: Home / Self Care | Payer: Medicaid Other | Attending: Emergency Medicine | Admitting: Emergency Medicine

## 2012-06-15 ENCOUNTER — Encounter (HOSPITAL_COMMUNITY): Payer: Self-pay

## 2012-06-15 DIAGNOSIS — S139XXA Sprain of joints and ligaments of unspecified parts of neck, initial encounter: Secondary | ICD-10-CM

## 2012-06-15 DIAGNOSIS — M25559 Pain in unspecified hip: Secondary | ICD-10-CM | POA: Insufficient documentation

## 2012-06-15 DIAGNOSIS — M25551 Pain in right hip: Secondary | ICD-10-CM

## 2012-06-15 DIAGNOSIS — S39012A Strain of muscle, fascia and tendon of lower back, initial encounter: Secondary | ICD-10-CM

## 2012-06-15 DIAGNOSIS — S335XXA Sprain of ligaments of lumbar spine, initial encounter: Secondary | ICD-10-CM | POA: Insufficient documentation

## 2012-06-15 MED ORDER — OXYCODONE-ACETAMINOPHEN 5-325 MG PO TABS
1.0000 | ORAL_TABLET | Freq: Once | ORAL | Status: AC
  Administered 2012-06-15: 1 via ORAL
  Filled 2012-06-15: qty 1

## 2012-06-15 MED ORDER — METHOCARBAMOL 750 MG PO TABS: 750.0000 mg | ORAL_TABLET | Freq: Three times a day (TID) | ORAL | Status: AC

## 2012-06-15 MED ORDER — NAPROXEN 500 MG PO TABS: 500.0000 mg | ORAL_TABLET | Freq: Two times a day (BID) | ORAL | Status: DC

## 2012-06-15 MED ORDER — OXYCODONE-ACETAMINOPHEN 5-325 MG PO TABS: 1.0000 | ORAL_TABLET | ORAL | Status: AC | PRN
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
## 2012-06-15 NOTE — ED Notes (Signed)
c-collar placed in triage
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
## 2012-06-17 ENCOUNTER — Encounter (HOSPITAL_COMMUNITY): Payer: Self-pay | Admitting: Emergency Medicine

## 2012-06-17 ENCOUNTER — Emergency Department (HOSPITAL_COMMUNITY)
Admission: EM | Admit: 2012-06-17 | Discharge: 2012-06-17 | Disposition: A | Discharge status: Home / Self Care | Payer: No Typology Code available for payment source | Attending: Emergency Medicine | Admitting: Emergency Medicine

## 2012-06-17 DIAGNOSIS — M545 Low back pain, unspecified: Secondary | ICD-10-CM | POA: Insufficient documentation

## 2012-06-17 DIAGNOSIS — M25559 Pain in unspecified hip: Secondary | ICD-10-CM | POA: Insufficient documentation

## 2012-06-17 DIAGNOSIS — F17200 Nicotine dependence, unspecified, uncomplicated: Secondary | ICD-10-CM | POA: Insufficient documentation

## 2012-06-17 DIAGNOSIS — M542 Cervicalgia: Secondary | ICD-10-CM | POA: Insufficient documentation

## 2012-06-17 MED ORDER — OXYCODONE-ACETAMINOPHEN 5-325 MG PO TABS
2.0000 | ORAL_TABLET | Freq: Once | ORAL | Status: AC
  Administered 2012-06-17: 2 via ORAL
  Filled 2012-06-17: qty 2

## 2012-06-17 NOTE — ED Notes (Signed)
Pt stable at discharge with steady ambulatory gait;

## 2012-06-17 NOTE — ED Notes (Signed)
Pt c/o neck, back and hip pain. Pain has been unrelieved by po pain meds was prescribed precocet on Saturday
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
## 2012-06-18 NOTE — ED Provider Notes (Signed)
Medical screening examination/treatment/procedure(s) were performed by non-physician practitioner and as supervising physician I was immediately available for consultation/collaboration.    Gemini Bunte, MD 06/18/12 1502 
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
## 2012-06-26 NOTE — ED Provider Notes (Signed)
Medical screening examination/treatment/procedure(s) were performed by non-physician practitioner and as supervising physician I was immediately available for consultation/collaboration.  Lark Runk, MD 06/26/12 1510 

## 2012-07-23 ENCOUNTER — Encounter (HOSPITAL_COMMUNITY): Payer: Self-pay | Admitting: *Deleted

## 2012-07-23 ENCOUNTER — Emergency Department (HOSPITAL_COMMUNITY): Payer: Medicaid Other

## 2012-07-23 ENCOUNTER — Emergency Department (HOSPITAL_COMMUNITY)
Admission: EM | Admit: 2012-07-23 | Discharge: 2012-07-23 | Disposition: A | Discharge status: Home / Self Care | Payer: Medicaid Other | Attending: Emergency Medicine | Admitting: Emergency Medicine

## 2012-07-23 DIAGNOSIS — M5432 Sciatica, left side: Secondary | ICD-10-CM

## 2012-07-23 DIAGNOSIS — F17200 Nicotine dependence, unspecified, uncomplicated: Secondary | ICD-10-CM | POA: Insufficient documentation

## 2012-07-23 DIAGNOSIS — Z9089 Acquired absence of other organs: Secondary | ICD-10-CM | POA: Insufficient documentation

## 2012-07-23 DIAGNOSIS — M5430 Sciatica, unspecified side: Secondary | ICD-10-CM | POA: Insufficient documentation

## 2012-07-23 MED ORDER — HYDROCODONE-ACETAMINOPHEN 5-325 MG PO TABS
1.0000 | ORAL_TABLET | Freq: Once | ORAL | Status: AC
  Administered 2012-07-23: 1 via ORAL
  Filled 2012-07-23: qty 1

## 2012-07-23 MED ORDER — PREDNISONE 50 MG PO TABS: ORAL_TABLET | ORAL | Status: AC

## 2012-07-23 MED ORDER — PREDNISONE 20 MG PO TABS
60.0000 mg | ORAL_TABLET | Freq: Once | ORAL | Status: AC
  Administered 2012-07-23: 60 mg via ORAL
  Filled 2012-07-23: qty 3

## 2012-07-23 MED ORDER — HYDROCODONE-ACETAMINOPHEN 5-325 MG PO TABS: 1.0000 | ORAL_TABLET | Freq: Four times a day (QID) | ORAL | Status: AC | PRN

## 2012-07-23 NOTE — ED Notes (Signed)
C/o lower back pain that radiates down left leg. Pt states that she has had problems with her back and leg before but the pain became worse two days ago, denies any injury.

## 2012-07-23 NOTE — ED Notes (Signed)
Alert, NAD, pain lt side of low back with radiation down lt leg. Has hurt intermittently since June. No known injury

## 2012-07-23 NOTE — ED Provider Notes (Signed)
History     CSN: [id number]  Arrival date & time 07/23/12  1559   First MD Initiated Contact with Patient 07/23/12 1813      Chief Complaint  Patient presents with  . Leg Pain    (Consider location/radiation/quality/duration/timing/severity/associated sxs/prior treatment) HPI Comments: No known injury.  Denies pre-existing low back problems.  Patient is a 46 y.o. female presenting with leg pain. The history is provided by the patient. No language interpreter was used.  Leg Pain  Incident onset: ~ 2 weeks ago. There was no injury mechanism. The pain is present in the left leg. The quality of the pain is described as burning. The pain is severe. The pain has been constant since onset. Pertinent negatives include no numbness. She has tried nothing for the symptoms.    History reviewed. No pertinent past medical history.  Past Surgical History  Procedure Date  . Abdominal hysterectomy   . Cholecystectomy   . Tubal ligation   . Ulcer surgery     No family history on file.  History  Substance Use Topics  . Smoking status: Current Everyday Smoker    Types: Cigarettes  . Smokeless tobacco: Not on file  . Alcohol Use: No     quit 6months ago    OB History    Grav Para Term Preterm Abortions TAB SAB Ect Mult Living                  Review of Systems  Constitutional: Negative for fever and chills.  Musculoskeletal: Positive for back pain.  Neurological: Negative for weakness and numbness.  All other systems reviewed and are negative.    Allergies  Review of patient's allergies indicates no known allergies.  Home Medications   Current Outpatient Rx  Name Route Sig Dispense Refill  . ASPIRIN-SALICYLAMIDE-CAFFEINE 650-195-33.3 MG PO PACK Oral Take 1 packet by mouth daily as needed. pain    . HYDROCODONE-ACETAMINOPHEN 5-325 MG PO TABS Oral Take 1 tablet by mouth every 6 (six) hours as needed for pain. 12 tablet 0  . PREDNISONE 50 MG PO TABS  One tab po QD 5 tablet 0      BP 103/67  Pulse 95  Temp 98.6 F (37 C) (Oral)  Resp 20  Ht 5\' 3"  (1.6 m)  Wt 101 lb 1 oz (45.842 kg)  BMI 17.90 kg/m2  SpO2 99%  Physical Exam  Nursing note and vitals reviewed. Constitutional: She is oriented to person, place, and time. She appears well-developed and well-nourished. No distress.  HENT:  Head: Normocephalic and atraumatic.  Eyes: EOM are normal.  Neck: Normal range of motion.  Cardiovascular: Normal rate, regular rhythm and normal heart sounds.   Pulmonary/Chest: Effort normal and breath sounds normal.  Abdominal: Soft. She exhibits no distension. There is no tenderness.  Musculoskeletal:       Lumbar back: She exhibits decreased range of motion, tenderness and pain. She exhibits no swelling, no edema, no deformity, no laceration, no spasm and normal pulse.       Back:       Pain radiates to L foot.  Neurological: She is alert and oriented to person, place, and time. She has normal strength. No sensory deficit. Coordination and gait normal. GCS eye subscore is 4. GCS verbal subscore is 5. GCS motor subscore is 6.  Reflex Scores:      Patellar reflexes are 2+ on the right side and 2+ on the left side.      Achilles  reflexes are 2+ on the right side and 2+ on the left side. Skin: Skin is warm and dry.  Psychiatric: She has a normal mood and affect. Judgment normal.    ED Course  Procedures (including critical care time)  Labs Reviewed - No data to display Dg Lumbar Spine Complete  07/23/2012  *RADIOLOGY REPORT*  Clinical Data: Intermittent low back pain with left lower extremity numbness for several months.  No acute injury.  LUMBAR SPINE - COMPLETE 4+ VIEW  Comparison: Radiographs 06/15/2012.  Findings: Five lumbar type vertebral bodies remain in anatomic alignment.  The disc spaces are preserved.  There is no evidence of acute fracture or pars defect.  Cholecystectomy clips and bilateral tubal ligation clips are noted.  IMPRESSION: Stable examination.  No  acute osseous findings or malalignment.   Original Report Authenticated By: Gerrianne Scale, M.D.      1. Left sided sciatica       MDM  rx-hydrocodone, 12 rx-prednisone 50 mg, 5        Evalina Field, [place] 07/23/12 2000

## 2012-07-25 NOTE — ED Provider Notes (Signed)
Medical screening examination/treatment/procedure(s) were performed by non-physician practitioner and as supervising physician I was immediately available for consultation/collaboration.   Joya Gaskins, MD 07/25/12 [phone]

## 2012-10-25 ENCOUNTER — Emergency Department (HOSPITAL_COMMUNITY)
Admission: EM | Admit: 2012-10-25 | Discharge: 2012-10-25 | Disposition: A | Discharge status: Home / Self Care | Payer: Medicaid Other | Attending: Emergency Medicine | Admitting: Emergency Medicine

## 2012-10-25 ENCOUNTER — Encounter (HOSPITAL_COMMUNITY): Payer: Self-pay | Admitting: *Deleted

## 2012-10-25 DIAGNOSIS — M79609 Pain in unspecified limb: Secondary | ICD-10-CM | POA: Insufficient documentation

## 2012-10-25 DIAGNOSIS — F17200 Nicotine dependence, unspecified, uncomplicated: Secondary | ICD-10-CM | POA: Insufficient documentation

## 2012-10-25 DIAGNOSIS — G8929 Other chronic pain: Secondary | ICD-10-CM | POA: Insufficient documentation

## 2012-10-25 DIAGNOSIS — M545 Low back pain, unspecified: Secondary | ICD-10-CM | POA: Insufficient documentation

## 2012-10-25 DIAGNOSIS — R55 Syncope and collapse: Secondary | ICD-10-CM | POA: Insufficient documentation

## 2012-10-25 DIAGNOSIS — IMO0002 Reserved for concepts with insufficient information to code with codable children: Secondary | ICD-10-CM | POA: Insufficient documentation

## 2012-10-25 MED ORDER — HYDROCODONE-ACETAMINOPHEN 5-325 MG PO TABS
1.0000 | ORAL_TABLET | Freq: Once | ORAL | Status: AC
  Administered 2012-10-25: 1 via ORAL
  Filled 2012-10-25: qty 1

## 2012-10-25 MED ORDER — PREDNISONE 20 MG PO TABS: 40.0000 mg | ORAL_TABLET | Freq: Every day | ORAL | Status: DC

## 2012-10-25 MED ORDER — PREDNISONE 50 MG PO TABS
60.0000 mg | ORAL_TABLET | Freq: Once | ORAL | Status: AC
  Administered 2012-10-25: 60 mg via ORAL
  Filled 2012-10-25: qty 1

## 2012-10-25 MED ORDER — HYDROCODONE-ACETAMINOPHEN 5-325 MG PO TABS: 1.0000 | ORAL_TABLET | Freq: Four times a day (QID) | ORAL | Status: AC | PRN

## 2012-10-25 NOTE — ED Notes (Signed)
Low back pain with radiation down lt leg. No injury known

## 2012-10-25 NOTE — ED Provider Notes (Signed)
History     CSN: [id number]  Arrival date & time 10/25/12  1304   First MD Initiated Contact with Patient 10/25/12 1500      Chief Complaint  Patient presents with  . Back Pain    (Consider location/radiation/quality/duration/timing/severity/associated sxs/prior treatment) HPI Comments: States the [facility] is working on a neuro referral for her.  Patient is a 46 y.o. female presenting with back pain. The history is provided by the patient. No language interpreter was used.  Back Pain  This is a chronic problem. The problem occurs constantly. The problem has not changed since onset.The pain is associated with no known injury. The pain is present in the lumbar spine. The pain radiates to the left thigh. The pain is severe. The symptoms are aggravated by bending, twisting and certain positions. The pain is the same all the time. Associated symptoms include numbness, leg pain and paresthesias. Pertinent negatives include no fever, no bowel incontinence, no perianal numbness, no bladder incontinence, no dysuria, no pelvic pain, no paresis, no tingling and no weakness. Treatments tried: tylenol. The treatment provided no relief.    History reviewed. No pertinent past medical history.  Past Surgical History  Procedure Date  . Abdominal hysterectomy   . Cholecystectomy   . Tubal ligation   . Ulcer surgery     History reviewed. No pertinent family history.  History  Substance Use Topics  . Smoking status: Current Every Day Smoker    Types: Cigarettes  . Smokeless tobacco: Not on file  . Alcohol Use: No     Comment: quit 6months ago    OB History    Grav Para Term Preterm Abortions TAB SAB Ect Mult Living                  Review of Systems  Constitutional: Negative for fever and chills.  Gastrointestinal: Negative for bowel incontinence.  Genitourinary: Negative for bladder incontinence, dysuria and pelvic pain.  Musculoskeletal: Positive for back pain.  Neurological: Positive  for numbness and paresthesias. Negative for tingling and weakness.  All other systems reviewed and are negative.    Allergies  Ultracet and Ultram  Home Medications   Current Outpatient Rx  Name  Route  Sig  Dispense  Refill  . ACETAMINOPHEN 500 MG PO TABS   Oral   Take 1,000 mg by mouth every 6 (six) hours as needed. Pain.         Marland Kitchen HYDROCODONE-ACETAMINOPHEN 5-325 MG PO TABS   Oral   Take 1 tablet by mouth every 6 (six) hours as needed for pain.   20 tablet   0   . PREDNISONE 20 MG PO TABS   Oral   Take 2 tablets (40 mg total) by mouth daily.   10 tablet   0     BP 124/68  Pulse 74  Temp 98 F (36.7 C) (Oral)  Resp 20  Ht 5\' 3"  (1.6 m)  Wt 100 lb (45.36 kg)  BMI 17.71 kg/m2  SpO2 100%  Physical Exam  Nursing note and vitals reviewed. Constitutional: She is oriented to person, place, and time. She appears well-developed and well-nourished. No distress.  HENT:  Head: Normocephalic and atraumatic.  Eyes: EOM are normal.  Neck: Normal range of motion.  Cardiovascular: Normal rate and regular rhythm.   Pulmonary/Chest: Effort normal.  Abdominal: Soft. She exhibits no distension. There is no tenderness.  Musculoskeletal: She exhibits tenderness.       Lumbar back: She exhibits decreased range  of motion, tenderness and pain. She exhibits no bony tenderness, no swelling, no edema, no deformity, no laceration, no spasm and normal pulse.       Back:  Neurological: She is alert and oriented to person, place, and time.  Skin: Skin is warm and dry.  Psychiatric: She has a normal mood and affect. Judgment normal.    ED Course  Procedures (including critical care time)  Labs Reviewed - No data to display No results found.   1. Chronic low back pain   2. Chronic radicular pain of lower back       MDM  rx-hydrocodone, 20 rx-prednisone 40 mg QD x 5 days. Ice F/u [facility]        Evalina Field, [place] 10/25/12 [phone]

## 2012-10-28 NOTE — ED Provider Notes (Signed)
Medical screening examination/treatment/procedure(s) were performed by non-physician practitioner and as supervising physician I was immediately available for consultation/collaboration.  Laticia Vannostrand, MD 10/28/12 0719 

## 2013-05-09 ENCOUNTER — Encounter (HOSPITAL_COMMUNITY): Payer: Self-pay | Admitting: *Deleted

## 2013-05-09 ENCOUNTER — Emergency Department (HOSPITAL_COMMUNITY)
Admission: EM | Admit: 2013-05-09 | Discharge: 2013-05-09 | Disposition: A | Discharge status: Home / Self Care | Payer: Self-pay | Attending: Emergency Medicine | Admitting: Emergency Medicine

## 2013-05-09 DIAGNOSIS — Z9851 Tubal ligation status: Secondary | ICD-10-CM | POA: Insufficient documentation

## 2013-05-09 DIAGNOSIS — F17200 Nicotine dependence, unspecified, uncomplicated: Secondary | ICD-10-CM | POA: Insufficient documentation

## 2013-05-09 DIAGNOSIS — A5901 Trichomonal vulvovaginitis: Secondary | ICD-10-CM | POA: Insufficient documentation

## 2013-05-09 DIAGNOSIS — R109 Unspecified abdominal pain: Secondary | ICD-10-CM | POA: Insufficient documentation

## 2013-05-09 DIAGNOSIS — A599 Trichomoniasis, unspecified: Secondary | ICD-10-CM

## 2013-05-09 DIAGNOSIS — Z9089 Acquired absence of other organs: Secondary | ICD-10-CM | POA: Insufficient documentation

## 2013-05-09 DIAGNOSIS — R300 Dysuria: Secondary | ICD-10-CM | POA: Insufficient documentation

## 2013-05-09 DIAGNOSIS — L293 Anogenital pruritus, unspecified: Secondary | ICD-10-CM | POA: Insufficient documentation

## 2013-05-09 DIAGNOSIS — Z90710 Acquired absence of both cervix and uterus: Secondary | ICD-10-CM | POA: Insufficient documentation

## 2013-05-09 DIAGNOSIS — R631 Polydipsia: Secondary | ICD-10-CM | POA: Insufficient documentation

## 2013-05-09 DIAGNOSIS — N730 Acute parametritis and pelvic cellulitis: Secondary | ICD-10-CM | POA: Insufficient documentation

## 2013-05-09 DIAGNOSIS — R110 Nausea: Secondary | ICD-10-CM | POA: Insufficient documentation

## 2013-05-09 DIAGNOSIS — R358 Other polyuria: Secondary | ICD-10-CM | POA: Insufficient documentation

## 2013-05-09 DIAGNOSIS — A499 Bacterial infection, unspecified: Secondary | ICD-10-CM | POA: Insufficient documentation

## 2013-05-09 DIAGNOSIS — R3589 Other polyuria: Secondary | ICD-10-CM | POA: Insufficient documentation

## 2013-05-09 DIAGNOSIS — N760 Acute vaginitis: Secondary | ICD-10-CM | POA: Insufficient documentation

## 2013-05-09 DIAGNOSIS — B9689 Other specified bacterial agents as the cause of diseases classified elsewhere: Secondary | ICD-10-CM | POA: Insufficient documentation

## 2013-05-09 LAB — URINALYSIS, ROUTINE W REFLEX MICROSCOPIC
Bilirubin Urine: NEGATIVE
Ketones, ur: NEGATIVE mg/dL
Nitrite: NEGATIVE
Protein, ur: NEGATIVE mg/dL
pH: 5.5 (ref 5.0–8.0)

## 2013-05-09 LAB — WET PREP, GENITAL: Yeast Wet Prep HPF POC: NONE SEEN

## 2013-05-09 LAB — URINE MICROSCOPIC-ADD ON

## 2013-05-09 MED ORDER — METRONIDAZOLE 500 MG PO TABS: 500.0000 mg | ORAL_TABLET | Freq: Two times a day (BID) | ORAL | Status: DC

## 2013-05-09 MED ORDER — CEFTRIAXONE SODIUM 250 MG IJ SOLR
250.0000 mg | Freq: Once | INTRAMUSCULAR | Status: AC
  Administered 2013-05-09: 250 mg via INTRAMUSCULAR
  Filled 2013-05-09: qty 250

## 2013-05-09 MED ORDER — LIDOCAINE HCL (PF) 1 % IJ SOLN
INTRAMUSCULAR | Status: AC
  Filled 2013-05-09: qty 5

## 2013-05-09 MED ORDER — IBUPROFEN 400 MG PO TABS
600.0000 mg | ORAL_TABLET | Freq: Once | ORAL | Status: AC
  Administered 2013-05-09: 600 mg via ORAL
  Filled 2013-05-09: qty 2

## 2013-05-09 MED ORDER — ONDANSETRON 4 MG PO TBDP
4.0000 mg | ORAL_TABLET | Freq: Once | ORAL | Status: AC
  Administered 2013-05-09: 4 mg via ORAL
  Filled 2013-05-09: qty 1

## 2013-05-09 MED ORDER — AZITHROMYCIN 250 MG PO TABS
1000.0000 mg | ORAL_TABLET | Freq: Once | ORAL | Status: AC
  Administered 2013-05-09: 1000 mg via ORAL
  Filled 2013-05-09: qty 4

## 2013-05-09 NOTE — ED Provider Notes (Signed)
History     CSN: [id number]  Arrival date & time 05/09/13  1235   First MD Initiated Contact with Patient 05/09/13 1258      Chief Complaint  Patient presents with  . Vaginal Discharge  . Dysuria    (Consider location/radiation/quality/duration/timing/severity/associated sxs/prior treatment) HPI Patient is G2 P1 Ab0, she reports she has had the same sexual partner and denies any new changes. She reports 2 months ago she started having dysuria and itching in her groin it feels like her genitalia swelling burning. She states she started out with thick green discharge without odor and now has a white discharge with an odor that she can feel pouring out of her vagina. She reports a weight loss over the past few months but does not know how much. She does have some discomfort in her lower abdominal area in the suprapubic region. She's had nausea without vomiting or diarrhea. She states she has polyuria, polydipsia, without hematuria. She states she dribbles small amounts of urine at a time. She denies any fever.  PCP none  History reviewed. No pertinent past medical history.  Past Surgical History  Procedure Laterality Date  . Abdominal hysterectomy    . Cholecystectomy    . Tubal ligation    . Ulcer surgery      No family history on file.  History  Substance Use Topics  . Smoking status: Current Every Day Smoker    Types: Cigarettes  . Smokeless tobacco: Not on file  . Alcohol Use: No     Comment: quit 6months ago  unemployed Smokes 1 ppd  OB History   Grav Para Term Preterm Abortions TAB SAB Ect Mult Living                  Review of Systems  All other systems reviewed and are negative.    Allergies  Ultracet and Ultram  Home Medications   Current Outpatient Rx  Name  Route  Sig  Dispense  Refill  . Aspirin-Caffeine (BC FAST PAIN RELIEF PO)   Oral   Take 1 each by mouth daily as needed (pain).         Marland Kitchen ibuprofen (ADVIL,MOTRIN) 200 MG tablet   Oral  Take 400 mg by mouth every 6 (six) hours as needed for pain.           BP 132/88  Pulse 88  Temp(Src) 98.8 F (37.1 C) (Oral)  Resp 18  Ht 5\' 3"  (1.6 m)  Wt 97 lb 1 oz (44.027 kg)  BMI 17.2 kg/m2  SpO2 100%  Vital signs normal    Physical Exam  Nursing note and vitals reviewed. Constitutional: She is oriented to person, place, and time.  Non-toxic appearance. She does not appear ill. No distress.  Thin, underweight  HENT:  Head: Normocephalic and atraumatic.  Right Ear: External ear normal.  Left Ear: External ear normal.  Nose: Nose normal. No mucosal edema or rhinorrhea.  Mouth/Throat: Oropharynx is clear and moist and mucous membranes are normal. No dental abscesses or edematous.  Eyes: Conjunctivae and EOM are normal. Pupils are equal, round, and reactive to light.  Neck: Normal range of motion and full passive range of motion without pain. Neck supple.  Cardiovascular: Normal rate, regular rhythm and normal heart sounds.  Exam reveals no gallop and no friction rub.   No murmur heard. Pulmonary/Chest: Effort normal and breath sounds normal. No respiratory distress. She has no wheezes. She has no rhonchi. She has no  rales. She exhibits no tenderness and no crepitus.  Abdominal: Soft. Normal appearance and bowel sounds are normal. She exhibits no distension. There is no tenderness. There is no rebound and no guarding.  Genitourinary:  Minimal redness of her external genitalia, she has a moderate amount of yellow/thick white discharge in her vault and the speculum appears to be very uncomfortable. She is tender to manipulation of the cervix and both of her adnexa however there are no masses present.  Musculoskeletal: Normal range of motion. She exhibits no edema and no tenderness.  Moves all extremities well.   Neurological: She is alert and oriented to person, place, and time. She has normal strength. No cranial nerve deficit.  Skin: Skin is warm, dry and intact. No rash  noted. No erythema. No pallor.  Psychiatric: She has a normal mood and affect. Her speech is normal and behavior is normal. Her mood appears not anxious.    ED Course  Procedures (including critical care time)  Medications  cefTRIAXone (ROCEPHIN) injection 250 mg (250 mg Intramuscular Given 05/09/13 1401)  ibuprofen (ADVIL,MOTRIN) tablet 600 mg (600 mg Oral Given 05/09/13 1401)  ondansetron (ZOFRAN-ODT) disintegrating tablet 4 mg (4 mg Oral Given 05/09/13 1543)  azithromycin (ZITHROMAX) tablet 1,000 mg (1,000 mg Oral Given 05/09/13 1543)   Pt given her test results.  She was treated for chlamydia with oral zitrhomax since she will be unable to afford doxycycline.   Results for orders placed during the hospital encounter of 05/09/13  WET PREP, GENITAL      Result Value Range   Yeast Wet Prep HPF POC NONE SEEN  NONE SEEN   Trich, Wet Prep FEW (*) NONE SEEN   Clue Cells Wet Prep HPF POC FEW (*) NONE SEEN   WBC, Wet Prep HPF POC TOO NUMEROUS TO COUNT (*) NONE SEEN  URINALYSIS, ROUTINE W REFLEX MICROSCOPIC      Result Value Range   Color, Urine YELLOW  YELLOW   APPearance CLEAR  CLEAR   Specific Gravity, Urine 1.020  1.005 - 1.030   pH 5.5  5.0 - 8.0   Glucose, UA NEGATIVE  NEGATIVE mg/dL   Hgb urine dipstick NEGATIVE  NEGATIVE   Bilirubin Urine NEGATIVE  NEGATIVE   Ketones, ur NEGATIVE  NEGATIVE mg/dL   Protein, ur NEGATIVE  NEGATIVE mg/dL   Urobilinogen, UA 0.2  0.0 - 1.0 mg/dL   Nitrite NEGATIVE  NEGATIVE   Leukocytes, UA MODERATE (*) NEGATIVE  URINE MICROSCOPIC-ADD ON      Result Value Range   Squamous Epithelial / LPF FEW (*) RARE   WBC, UA 7-10  <3 WBC/hpf   RBC / HPF 0-2  <3 RBC/hpf   Bacteria, UA FEW (*) RARE  GLUCOSE, CAPILLARY      Result Value Range   Glucose-Capillary 57 (*) 70 - 99 mg/dL   Laboratory interpretation all normal except bacterial vaginosis, trichomoniasis, hypoglycemia     1. BV (bacterial vaginosis)   2. Trichomoniasis   3. PID (acute pelvic  inflammatory disease)      Discharge Medication List as of 05/09/2013  3:29 PM    START taking these medications   Details  metroNIDAZOLE (FLAGYL) 500 MG tablet Take 1 tablet (500 mg total) by mouth 2 (two) times daily., Starting 05/09/2013, Until Discontinued, Print        Plan discharge  Devoria Albe, MD, FACEP   MDM          Ward Givens, MD 05/09/13 1910

## 2013-05-09 NOTE — ED Notes (Signed)
Pt states vaginal discharge green and yellow in color initially 2 mo ago, now white. Burning with urination and vaginal itching. Back pain at times.

## 2013-05-09 NOTE — ED Notes (Signed)
Given peanut butter crackers and regular soda.  Pt states she has not eaten today

## 2013-05-10 LAB — URINE CULTURE

## 2013-10-03 ENCOUNTER — Emergency Department (HOSPITAL_COMMUNITY)
Admission: EM | Admit: 2013-10-03 | Discharge: 2013-10-03 | Disposition: A | Discharge status: Home / Self Care | Payer: Self-pay | Attending: Emergency Medicine | Admitting: Emergency Medicine

## 2013-10-03 ENCOUNTER — Encounter (HOSPITAL_COMMUNITY): Payer: Self-pay | Admitting: Emergency Medicine

## 2013-10-03 DIAGNOSIS — R52 Pain, unspecified: Secondary | ICD-10-CM | POA: Insufficient documentation

## 2013-10-03 DIAGNOSIS — M545 Low back pain, unspecified: Secondary | ICD-10-CM | POA: Insufficient documentation

## 2013-10-03 DIAGNOSIS — M79609 Pain in unspecified limb: Secondary | ICD-10-CM | POA: Insufficient documentation

## 2013-10-03 DIAGNOSIS — F17200 Nicotine dependence, unspecified, uncomplicated: Secondary | ICD-10-CM | POA: Insufficient documentation

## 2013-10-03 DIAGNOSIS — G8929 Other chronic pain: Secondary | ICD-10-CM | POA: Insufficient documentation

## 2013-10-03 MED ORDER — METHOCARBAMOL 500 MG PO TABS: 500.0000 mg | ORAL_TABLET | Freq: Two times a day (BID) | ORAL | Status: DC

## 2013-10-03 MED ORDER — MELOXICAM 7.5 MG PO TABS: 7.5000 mg | ORAL_TABLET | Freq: Every day | ORAL | Status: DC

## 2013-10-03 MED ORDER — OXYCODONE-ACETAMINOPHEN 5-325 MG PO TABS
1.0000 | ORAL_TABLET | Freq: Once | ORAL | Status: AC
  Administered 2013-10-03: 1 via ORAL
  Filled 2013-10-03: qty 1

## 2013-10-03 NOTE — ED Notes (Signed)
Pt reports has chronic lower back pain that radiates into left leg.  Reports moved some furniture yesterday and pain increased.

## 2013-10-03 NOTE — ED Provider Notes (Signed)
CSN: [id number]     Arrival date & time 10/03/13  1107 History   First MD Initiated Contact with Patient 10/03/13 1134     Chief Complaint  Patient presents with  . Back Pain   (Consider location/radiation/quality/duration/timing/severity/associated sxs/prior Treatment) Patient is a 47 y.o. female presenting with back pain. The history is provided by the patient.  Back Pain Location:  Lumbar spine Radiates to:  L posterior upper leg Pain severity:  Severe Onset quality:  Gradual Duration:  1 day Timing:  Constant Progression:  Unchanged Chronicity:  Chronic Relieved by:  None tried Worsened by:  Movement, ambulation and standing Ineffective treatments:  None tried Associated symptoms: leg pain   Associated symptoms: no abdominal pain, no bladder incontinence, no bowel incontinence, no chest pain, no dysuria, no fever and no headaches    Joanna Stephens is a 47 y.o. female who presents to the ED with low back pain. She has a history of chronic back pain. She was moving furniture yesterday and the pain increased. The pain is worse with movement and walking.   History reviewed. No pertinent past medical history. Past Surgical History  Procedure Laterality Date  . Abdominal hysterectomy    . Cholecystectomy    . Tubal ligation    . Ulcer surgery     No family history on file. History  Substance Use Topics  . Smoking status: Current Every Day Smoker    Types: Cigarettes  . Smokeless tobacco: Not on file  . Alcohol Use: No     Comment: quit 6months ago   OB History   Grav Para Term Preterm Abortions TAB SAB Ect Mult Living                 Review of Systems  Constitutional: Negative for fever and chills.  HENT: Negative.   Eyes: Negative for visual disturbance.  Respiratory: Negative for chest tightness.   Cardiovascular: Negative for chest pain.  Gastrointestinal: Negative for nausea, vomiting, abdominal pain and bowel incontinence.  Genitourinary: Negative for  bladder incontinence and dysuria.  Musculoskeletal: Positive for back pain.  Skin: Negative for wound.  Neurological: Negative for headaches.  Psychiatric/Behavioral: The patient is not nervous/anxious.     Allergies  Ultracet and Ultram  Home Medications   Current Outpatient Rx  Name  Route  Sig  Dispense  Refill  . acetaminophen (TYLENOL) 500 MG tablet   Oral   Take 1,000 mg by mouth every 4 (four) hours.         . Aspirin-Caffeine (BC FAST PAIN RELIEF PO)   Oral   Take 1 each by mouth daily as needed (pain).          BP 128/60  Pulse 75  Temp(Src) 97.9 F (36.6 C) (Oral)  Resp 18  Ht 5\' 3"  (1.6 m)  Wt 100 lb (45.36 kg)  BMI 17.72 kg/m2  SpO2 100% Physical Exam  Nursing note and vitals reviewed. Constitutional: She is oriented to person, place, and time. She appears well-developed and well-nourished.  HENT:  Head: Normocephalic and atraumatic.  Eyes: EOM are normal.  Neck: Normal range of motion. Neck supple.  Cardiovascular: Normal rate and regular rhythm.   Pulmonary/Chest: Effort normal and breath sounds normal.  Abdominal: Soft. There is no tenderness.  Musculoskeletal:       Lumbar back: She exhibits decreased range of motion, tenderness and spasm. She exhibits normal pulse.       Back:  Pedal pulses present and equal, adequate circulation,  good touch sensation.  Neurological: She is alert and oriented to person, place, and time. She has normal strength and normal reflexes. No cranial nerve deficit or sensory deficit. Gait normal.  Skin: Skin is warm and dry.  Psychiatric: She has a normal mood and affect. Her behavior is normal.    ED Course  Procedures  MDM  47 y.o. female with acute execration of chronic low back pain after moving furniture yesterday.  Discussed with the patient clinical findings and plan of care and all questioned fully answered. She will return if any problems arise.     Medication List    TAKE these medications        meloxicam 7.5 MG tablet  Commonly known as:  MOBIC  Take 1 tablet (7.5 mg total) by mouth daily.     methocarbamol 500 MG tablet  Commonly known as:  ROBAXIN  Take 1 tablet (500 mg total) by mouth 2 (two) times daily.      ASK your doctor about these medications       acetaminophen 500 MG tablet  Commonly known as:  TYLENOL  Take 1,000 mg by mouth every 4 (four) hours.     BC FAST PAIN RELIEF PO  Take 1 each by mouth daily as needed (pain).           [facility] Orlene Och, NP 10/04/13 [phone]

## 2013-10-07 NOTE — ED Provider Notes (Signed)
Medical screening examination/treatment/procedure(s) were performed by non-physician practitioner and as supervising physician I was immediately available for consultation/collaboration.  EKG Interpretation   None         Chealsea Paske J Viraat Vanpatten, MD 10/07/13 1953 

## 2014-03-21 ENCOUNTER — Encounter (HOSPITAL_COMMUNITY): Payer: Self-pay | Admitting: Emergency Medicine

## 2014-03-21 ENCOUNTER — Emergency Department (HOSPITAL_COMMUNITY)
Admission: EM | Admit: 2014-03-21 | Discharge: 2014-03-21 | Disposition: A | Discharge status: Home / Self Care | Payer: Self-pay | Attending: Emergency Medicine | Admitting: Emergency Medicine

## 2014-03-21 DIAGNOSIS — Z791 Long term (current) use of non-steroidal anti-inflammatories (NSAID): Secondary | ICD-10-CM | POA: Insufficient documentation

## 2014-03-21 DIAGNOSIS — Y93D3 Activity, furniture building and finishing: Secondary | ICD-10-CM | POA: Insufficient documentation

## 2014-03-21 DIAGNOSIS — F17200 Nicotine dependence, unspecified, uncomplicated: Secondary | ICD-10-CM | POA: Insufficient documentation

## 2014-03-21 DIAGNOSIS — IMO0002 Reserved for concepts with insufficient information to code with codable children: Secondary | ICD-10-CM | POA: Insufficient documentation

## 2014-03-21 DIAGNOSIS — Y929 Unspecified place or not applicable: Secondary | ICD-10-CM | POA: Insufficient documentation

## 2014-03-21 DIAGNOSIS — Z79899 Other long term (current) drug therapy: Secondary | ICD-10-CM | POA: Insufficient documentation

## 2014-03-21 DIAGNOSIS — X500XXA Overexertion from strenuous movement or load, initial encounter: Secondary | ICD-10-CM | POA: Insufficient documentation

## 2014-03-21 DIAGNOSIS — S86919A Strain of unspecified muscle(s) and tendon(s) at lower leg level, unspecified leg, initial encounter: Secondary | ICD-10-CM

## 2014-03-21 NOTE — ED Notes (Signed)
Pt c/o left leg/knee pain since yesterday. Pt states pain began while moving furniture. Pt was ambulatory on arrival to ED.

## 2014-03-21 NOTE — Discharge Instructions (Signed)
Knee Sprain °A knee sprain is a tear in the strong bands of tissue that connect the bones (ligaments) of your knee. °HOME CARE °· Raise (elevate) your injured knee to lessen puffiness (swelling). °· To ease pain and puffiness, put ice on the injured area. °· Put ice in a plastic bag. °· Place a towel between your skin and the bag. °· Leave the ice on for 20 minutes, 2 3 times a day. °· Only take medicine as told by your doctor. °· Do not leave your knee unprotected until pain and stiffness go away (usually 4 6 weeks). °· If you have a cast or splint, do not get it wet. If your doctor told you to not take it off, cover it with a plastic bag when you shower or bathe. Do not swim. °· Your doctor may have you do exercises to prevent or limit permanent weakness and stiffness. °GET HELP RIGHT AWAY IF:  °· Your cast or splint becomes damaged. °· Your pain gets worse. °· You have a lot of pain, puffiness, or numbness below the cast or splint. °MAKE SURE YOU:  °· Understand these instructions. °· Will watch your condition. °· Will get help right away if you are not doing well or get worse. °Document Released: 10/25/2009 Document Revised: 08/27/2013 Document Reviewed: 07/15/2013 °[employer]® Patient Information ©2014 [employer]. ° °

## 2014-03-21 NOTE — ED Provider Notes (Signed)
CSN: [id number]     Arrival date & time 03/21/14  1027 History  This chart was scribed for Joanna Creasehristopher J. Pollina, MD by Quintella ReichertMatthew Underwood, ED scribe.  This patient was seen in room APA09/APA09 and the patient's care was started at 10:42 AM.   Chief Complaint  Patient presents with  . Leg Pain    The history is provided by the patient. No language interpreter was used.    HPI Comments: Joanna Stephens is a 48 y.o. female who presents to the Emergency Department complaining of constant moderate left leg and knee pain that began yesterday.  Pt states she was moving furniture before onset of pain and she may have twisted the knee.  She denies any other injury.      History reviewed. No pertinent past medical history.   Past Surgical History  Procedure Laterality Date  . Abdominal hysterectomy    . Cholecystectomy    . Tubal ligation    . Ulcer surgery      History reviewed. No pertinent family history.   History  Substance Use Topics  . Smoking status: Current Every Day Smoker    Types: Cigarettes  . Smokeless tobacco: Not on file  . Alcohol Use: No     Comment: quit 6months ago    OB History   Grav Para Term Preterm Abortions TAB SAB Ect Mult Living                   Review of Systems  Musculoskeletal: Positive for arthralgias.  All other systems reviewed and are negative.     Allergies  Ultracet and Ultram  Home Medications   Prior to Admission medications   Medication Sig Start Date End Date Taking? Authorizing Provider  acetaminophen (TYLENOL) 500 MG tablet Take 1,000 mg by mouth every 4 (four) hours.    Historical Provider, MD  Aspirin-Caffeine (BC FAST PAIN RELIEF PO) Take 1 each by mouth daily as needed (pain).    Historical Provider, MD  meloxicam (MOBIC) 7.5 MG tablet Take 1 tablet (7.5 mg total) by mouth daily. 10/03/13   Hope Orlene OchM Neese, NP  methocarbamol (ROBAXIN) 500 MG tablet Take 1 tablet (500 mg total) by mouth 2 (two) times daily. 10/03/13    Hope Orlene OchM Neese, NP   BP 126/60  Pulse 85  Temp(Src) 98.1 F (36.7 C) (Oral)  Resp 18  SpO2 99%  Physical Exam  Nursing note and vitals reviewed. Constitutional: She is oriented to person, place, and time. She appears well-developed and well-nourished. No distress.  HENT:  Head: Normocephalic and atraumatic.  Right Ear: Hearing normal.  Left Ear: Hearing normal.  Nose: Nose normal.  Mouth/Throat: Oropharynx is clear and moist and mucous membranes are normal.  Eyes: Conjunctivae and EOM are normal. Pupils are equal, round, and reactive to light.  Neck: Normal range of motion. Neck supple.  Cardiovascular: Regular rhythm, S1 normal and S2 normal.  Exam reveals no gallop and no friction rub.   No murmur heard. Pulmonary/Chest: Effort normal and breath sounds normal. No respiratory distress. She exhibits no tenderness.  Abdominal: Soft. Normal appearance and bowel sounds are normal. There is no hepatosplenomegaly. There is no tenderness. There is no rebound, no guarding, no tenderness at McBurney's point and negative Murphy's sign. No hernia.  Musculoskeletal: Normal range of motion.       Left knee: She exhibits no swelling and no effusion. Tenderness (diffuse) found.  Neurological: She is alert and oriented to person, place, and  time. She has normal strength. No cranial nerve deficit or sensory deficit. Coordination normal. GCS eye subscore is 4. GCS verbal subscore is 5. GCS motor subscore is 6.  Skin: Skin is warm, dry and intact. No rash noted. No cyanosis.  Psychiatric: She has a normal mood and affect. Her speech is normal and behavior is normal. Thought content normal.    ED Course  Procedures (including critical care time)  DIAGNOSTIC STUDIES: Oxygen Saturation is 99% on room air, normal by my interpretation.    COORDINATION OF CARE: 10:45 AM-Discussed treatment plan which includes pain management with pt at bedside and pt agreed to plan.     Labs Review Labs Reviewed -  No data to display  Imaging Review No results found.   EKG Interpretation None      MDM   Final diagnoses:  Knee strain   Patient well known to myself from countless visits to the ER. She has a history of drug-seeking behavior and chronic pain syndromes. She presents with pain in the knee that she says occurred while moving furniture. I do not see any external signs of injury. There is no effusion. No redness or swelling to suggest septic joint. No need for imaging. Continue over-the-counter pain medication, was given a knee immobilizer.   I personally performed the services described in this documentation, which was scribed in my presence. The recorded information has been reviewed and is accurate.     Joanna Creasehristopher J. Pollina, MD 03/21/14 1106

## 2014-05-16 ENCOUNTER — Encounter (HOSPITAL_COMMUNITY): Payer: Self-pay | Admitting: Emergency Medicine

## 2014-05-16 ENCOUNTER — Emergency Department (HOSPITAL_COMMUNITY): Payer: Self-pay

## 2014-05-16 ENCOUNTER — Emergency Department (HOSPITAL_COMMUNITY)
Admission: EM | Admit: 2014-05-16 | Discharge: 2014-05-16 | Disposition: A | Discharge status: Home / Self Care | Payer: Self-pay | Attending: Emergency Medicine | Admitting: Emergency Medicine

## 2014-05-16 DIAGNOSIS — F17200 Nicotine dependence, unspecified, uncomplicated: Secondary | ICD-10-CM | POA: Insufficient documentation

## 2014-05-16 DIAGNOSIS — R110 Nausea: Secondary | ICD-10-CM | POA: Insufficient documentation

## 2014-05-16 DIAGNOSIS — G8929 Other chronic pain: Secondary | ICD-10-CM | POA: Insufficient documentation

## 2014-05-16 DIAGNOSIS — Z79899 Other long term (current) drug therapy: Secondary | ICD-10-CM | POA: Insufficient documentation

## 2014-05-16 DIAGNOSIS — R51 Headache: Secondary | ICD-10-CM | POA: Insufficient documentation

## 2014-05-16 DIAGNOSIS — Z8711 Personal history of peptic ulcer disease: Secondary | ICD-10-CM | POA: Insufficient documentation

## 2014-05-16 DIAGNOSIS — J40 Bronchitis, not specified as acute or chronic: Secondary | ICD-10-CM | POA: Insufficient documentation

## 2014-05-16 DIAGNOSIS — IMO0002 Reserved for concepts with insufficient information to code with codable children: Secondary | ICD-10-CM | POA: Insufficient documentation

## 2014-05-16 DIAGNOSIS — M549 Dorsalgia, unspecified: Secondary | ICD-10-CM | POA: Insufficient documentation

## 2014-05-16 LAB — COMPREHENSIVE METABOLIC PANEL
ALBUMIN: 3.6 g/dL (ref 3.5–5.2)
ALK PHOS: 98 U/L (ref 39–117)
ALT: 18 U/L (ref 0–35)
AST: 23 U/L (ref 0–37)
BUN: 10 mg/dL (ref 6–23)
CALCIUM: 9.3 mg/dL (ref 8.4–10.5)
CO2: 26 mEq/L (ref 19–32)
Chloride: 106 mEq/L (ref 96–112)
Creatinine, Ser: 0.6 mg/dL (ref 0.50–1.10)
GFR calc Af Amer: >90 mL/min (ref >90)
GFR calc non Af Amer: >90 mL/min (ref >90)
Glucose, Bld: 110 mg/dL — ABNORMAL HIGH (ref 70–99)
POTASSIUM: 4 meq/L (ref 3.7–5.3)
Sodium: 144 mEq/L (ref 137–147)
TOTAL PROTEIN: 6.7 g/dL (ref 6.0–8.3)
Total Bilirubin: <0.2 mg/dL — ABNORMAL LOW (ref 0.3–1.2)

## 2014-05-16 LAB — CBC WITH DIFFERENTIAL/PLATELET
Basophils Absolute: 0 10*3/uL (ref 0.0–0.1)
Basophils Relative: 1 % (ref 0–1)
EOS PCT: 0 % (ref 0–5)
Eosinophils Absolute: 0 10*3/uL (ref 0.0–0.7)
HEMATOCRIT: 34.7 % — AB (ref 36.0–46.0)
HEMOGLOBIN: 10.9 g/dL — AB (ref 12.0–15.0)
LYMPHS ABS: 1.8 10*3/uL (ref 0.7–4.0)
LYMPHS PCT: 33 % (ref 12–46)
MCH: 26.8 pg (ref 26.0–34.0)
MCHC: 31.4 g/dL (ref 30.0–36.0)
MCV: 85.5 fL (ref 78.0–100.0)
MONO ABS: 0.6 10*3/uL (ref 0.1–1.0)
MONOS PCT: 11 % (ref 3–12)
Neutro Abs: 3.1 10*3/uL (ref 1.7–7.7)
Neutrophils Relative %: 55 % (ref 43–77)
Platelets: 314 10*3/uL (ref 150–400)
RBC: 4.06 MIL/uL (ref 3.87–5.11)
RDW: 16.9 % — ABNORMAL HIGH (ref 11.5–15.5)
WBC: 5.5 10*3/uL (ref 4.0–10.5)

## 2014-05-16 LAB — PRO B NATRIURETIC PEPTIDE: Pro B Natriuretic peptide (BNP): 53.1 pg/mL (ref 0–125)

## 2014-05-16 LAB — TROPONIN I

## 2014-05-16 MED ORDER — SODIUM CHLORIDE 0.9 % IV SOLN
INTRAVENOUS | Status: DC
  Administered 2014-05-16: 14:00:00 via INTRAVENOUS

## 2014-05-16 MED ORDER — IPRATROPIUM-ALBUTEROL 0.5-2.5 (3) MG/3ML IN SOLN
3.0000 mL | Freq: Once | RESPIRATORY_TRACT | Status: AC
  Administered 2014-05-16: 3 mL via RESPIRATORY_TRACT
  Filled 2014-05-16: qty 3

## 2014-05-16 MED ORDER — PREDNISONE 10 MG PO TABS: 40.0000 mg | ORAL_TABLET | Freq: Every day | ORAL | Status: DC

## 2014-05-16 MED ORDER — ALBUTEROL SULFATE HFA 108 (90 BASE) MCG/ACT IN AERS
2.0000 | INHALATION_SPRAY | Freq: Four times a day (QID) | RESPIRATORY_TRACT | Status: DC
  Administered 2014-05-16: 2 via RESPIRATORY_TRACT
  Filled 2014-05-16 (×2): qty 6.7

## 2014-05-16 MED ORDER — SODIUM CHLORIDE 0.9 % IV BOLUS (SEPSIS)
500.0000 mL | Freq: Once | INTRAVENOUS | Status: AC
  Administered 2014-05-16: 500 mL via INTRAVENOUS

## 2014-05-16 MED ORDER — HYDROMORPHONE HCL PF 1 MG/ML IJ SOLN
1.0000 mg | Freq: Once | INTRAMUSCULAR | Status: AC
  Administered 2014-05-16: 1 mg via INTRAVENOUS
  Filled 2014-05-16: qty 1

## 2014-05-16 MED ORDER — PREDNISONE 50 MG PO TABS
60.0000 mg | ORAL_TABLET | Freq: Once | ORAL | Status: AC
  Administered 2014-05-16: 60 mg via ORAL
  Filled 2014-05-16 (×2): qty 1

## 2014-05-16 MED ORDER — ONDANSETRON HCL 4 MG/2ML IJ SOLN
4.0000 mg | Freq: Once | INTRAMUSCULAR | Status: AC
Start: 2014-05-16 — End: 2014-05-16
  Administered 2014-05-16: 4 mg via INTRAVENOUS
  Filled 2014-05-16: qty 2

## 2014-05-16 NOTE — ED Notes (Signed)
Pt's pain worsens with cough, deep breathing, and palpation

## 2014-05-16 NOTE — ED Provider Notes (Signed)
CSN: [id number]     Arrival date & time 05/16/14  1052 History  This chart was scribed for Vanetta MuldersScott Zackowski, MD by [place] Peace, ED Scribe. The patient was seen in [facility]. The patient's care was started at 12:24 PM.    Chief Complaint  Patient presents with  . Cough  . Chest Pain      Patient is a 48 y.o. female presenting with cough and chest pain. The history is provided by the patient. No language interpreter was used.  Cough Duration:  1 week Smoker: yes   Associated symptoms: chest pain, chills, fever, headaches, rhinorrhea and shortness of breath   Associated symptoms: no rash and no sore throat   Chest Pain Pain location:  L chest Pain quality: stabbing   Pain radiates to:  Upper back Pain radiates to the back: yes   Pain severity:  Severe Context: drug use (Smokes cigarettes daily (1 pack))   Associated symptoms: abdominal pain, back pain, cough, fever, headache, nausea and shortness of breath   Associated symptoms: not vomiting    HPI Comments: Joanna Stephens is a 48 y.o. female who presents to the Emergency Department complaining of non-productive cough over the past week and left sided radiating chest pain that has been occurring over the past few months. She describes pain as stabbing and states that it radiates to her upper back. She also complains of associated headaches, SOB, nausea, and rhinorrhea. Pt denies diarrhea or vomiting. Pt is an everyday smoker (1 pack daily). She has no PCP.    History reviewed. No pertinent past medical history. Past Surgical History  Procedure Laterality Date  . Abdominal hysterectomy    . Cholecystectomy    . Tubal ligation    . Ulcer surgery     History reviewed. No pertinent family history. History  Substance Use Topics  . Smoking status: Current Every Day Smoker -- 1.00 packs/day for 30 years    Types: Cigarettes  . Smokeless tobacco: Never Used  . Alcohol Use: No     Comment: quit 6months ago   OB History    Grav Para Term Preterm Abortions TAB SAB Ect Mult Living   2 1  1 1  1   1      Review of Systems  Constitutional: Positive for fever and chills.  HENT: Positive for congestion and rhinorrhea. Negative for sore throat.   Eyes: Negative for visual disturbance.  Respiratory: Positive for cough and shortness of breath.   Cardiovascular: Positive for chest pain.  Gastrointestinal: Positive for nausea and abdominal pain. Negative for vomiting and diarrhea.  Musculoskeletal: Positive for back pain. Negative for joint swelling.  Skin: Negative for rash.  Neurological: Positive for headaches.  Hematological: Does not bruise/bleed easily.      Allergies  Ultracet and Ultram  Home Medications   Prior to Admission medications   Medication Sig Start Date End Date Taking? Authorizing Altheria Shadoan  Aspirin-Caffeine (BC FAST PAIN RELIEF PO) Take 2 each by mouth daily as needed (pain).    Yes Historical Arrabella Westerman, MD  predniSONE (DELTASONE) 10 MG tablet Take 4 tablets (40 mg total) by mouth daily. 05/16/14   Vanetta MuldersScott Zackowski, MD   Triage Vitals: BP 135/70  Pulse 84  Temp(Src) 98.2 F (36.8 C) (Oral)  Resp 18  SpO2 97% Physical Exam  Nursing note and vitals reviewed. Constitutional: She is oriented to person, place, and time. She appears well-developed and well-nourished. No distress.  HENT:  Head: Normocephalic and atraumatic.  Eyes:  Conjunctivae and EOM are normal.  Neck: Normal range of motion. Neck supple. No tracheal deviation present.  Cardiovascular: Normal rate and regular rhythm.   No murmur heard. Pulmonary/Chest: Effort normal and breath sounds normal.  SpO2 in the room air: 98%  Abdominal: Bowel sounds are normal. She exhibits no distension. There is tenderness.  Musculoskeletal: Normal range of motion.  Neurological: She is alert and oriented to person, place, and time. No cranial nerve deficit. She exhibits normal muscle tone. Coordination normal.  Skin: Skin is warm and dry.  She is not diaphoretic.  Psychiatric: She has a normal mood and affect. Her behavior is normal.    ED Course  Procedures (including critical care time) DIAGNOSTIC STUDIES: Oxygen Saturation is 97% on room air, normal by my interpretation.    COORDINATION OF CARE: 12:30 PM- Treatment plan was discussed with patient who verbalizes understanding and agrees.     Labs Review Labs Reviewed  COMPREHENSIVE METABOLIC PANEL - Abnormal; Notable for the following:    Glucose, Bld 110 (*)    Total Bilirubin <0.2 (*)    All other components within normal limits  CBC WITH DIFFERENTIAL - Abnormal; Notable for the following:    Hemoglobin 10.9 (*)    HCT 34.7 (*)    RDW 16.9 (*)    All other components within normal limits  TROPONIN I  PRO B NATRIURETIC PEPTIDE    Imaging Review Dg Chest 2 View  05/16/2014   CLINICAL DATA:  Chest pain.  Headache.  EXAM: CHEST  2 VIEW  COMPARISON:  Chest radiograph 06/01/2012  FINDINGS: Normal cardiac and mediastinal contours. Lungs are clear. No pleural effusion or pneumothorax. Regional skeleton is unremarkable.  IMPRESSION: No acute cardiopulmonary process.   Electronically Signed   By: Annia Belt M.D.   On: 05/16/2014 15:02    Results for orders placed during the hospital encounter of 05/16/14  TROPONIN I      Result Value Ref Range   Troponin I <0.30  <0.30 ng/mL  PRO B NATRIURETIC PEPTIDE      Result Value Ref Range   Pro B Natriuretic peptide (BNP) 53.1  0 - 125 pg/mL  COMPREHENSIVE METABOLIC PANEL      Result Value Ref Range   Sodium 144  137 - 147 mEq/L   Potassium 4.0  3.7 - 5.3 mEq/L   Chloride 106  96 - 112 mEq/L   CO2 26  19 - 32 mEq/L   Glucose, Bld 110 (*) 70 - 99 mg/dL   BUN 10  6 - 23 mg/dL   Creatinine, Ser 1.61  0.50 - 1.10 mg/dL   Calcium 9.3  8.4 - 09.6 mg/dL   Total Protein 6.7  6.0 - 8.3 g/dL   Albumin 3.6  3.5 - 5.2 g/dL   AST 23  0 - 37 U/L   ALT 18  0 - 35 U/L   Alkaline Phosphatase 98  39 - 117 U/L   Total Bilirubin  <0.2 (*) 0.3 - 1.2 mg/dL   GFR calc non Af Amer >90  >90 mL/min   GFR calc Af Amer >90  >90 mL/min  CBC WITH DIFFERENTIAL      Result Value Ref Range   WBC 5.5  4.0 - 10.5 K/uL   RBC 4.06  3.87 - 5.11 MIL/uL   Hemoglobin 10.9 (*) 12.0 - 15.0 g/dL   HCT 04.5 (*) 40.9 - 81.1 %   MCV 85.5  78.0 - 100.0 fL   MCH 26.8  26.0 - 34.0 pg   MCHC 31.4  30.0 - 36.0 g/dL   RDW 16.116.9 (*) 09.611.5 - 04.515.5 %   Platelets 314  150 - 400 K/uL   Neutrophils Relative % 55  43 - 77 %   Neutro Abs 3.1  1.7 - 7.7 K/uL   Lymphocytes Relative 33  12 - 46 %   Lymphs Abs 1.8  0.7 - 4.0 K/uL   Monocytes Relative 11  3 - 12 %   Monocytes Absolute 0.6  0.1 - 1.0 K/uL   Eosinophils Relative 0  0 - 5 %   Eosinophils Absolute 0.0  0.0 - 0.7 K/uL   Basophils Relative 1  0 - 1 %   Basophils Absolute 0.0  0.0 - 0.1 K/uL   Dg Chest 2 View  05/16/2014   CLINICAL DATA:  Chest pain.  Headache.  EXAM: CHEST  2 VIEW  COMPARISON:  Chest radiograph 06/01/2012  FINDINGS: Normal cardiac and mediastinal contours. Lungs are clear. No pleural effusion or pneumothorax. Regional skeleton is unremarkable.  IMPRESSION: No acute cardiopulmonary process.   Electronically Signed   By: Annia Beltrew  Davis M.D.   On: 05/16/2014 15:02        EKG Interpretation   Date/Time:  Saturday May 16 2014 11:11:26 EDT Ventricular Rate:  70 PR Interval:  137 QRS Duration: 67 QT Interval:  376 QTC Calculation: 406 R Axis:   75 Text Interpretation:  Sinus rhythm Baseline wander in lead(s) V5 No  significant change since last tracing Confirmed by ZACKOWSKI  MD, SCOTT  [phone]([id number]) on 05/16/2014 12:39:18 PM     Medications  0.9 %  sodium chloride infusion ( Intravenous New Bag/Given 05/16/14 1428)  predniSONE (DELTASONE) tablet 60 mg (not administered)  albuterol (PROVENTIL HFA;VENTOLIN HFA) 108 (90 BASE) MCG/ACT inhaler 2 puff (not administered)  sodium chloride 0.9 % bolus 500 mL (0 mLs Intravenous Stopped 05/16/14 1419)  ondansetron (ZOFRAN) injection 4 mg  (4 mg Intravenous Given 05/16/14 1359)  HYDROmorphone (DILAUDID) injection 1 mg (1 mg Intravenous Given 05/16/14 1400)  ipratropium-albuterol (DUONEB) 0.5-2.5 (3) MG/3ML nebulizer solution 3 mL (3 mLs Nebulization Given 05/16/14 1338)    MDM   Final diagnoses:  Bronchitis    Patient workup for the chest pain and cough and shortness of breath most likely consistent with bronchitis. Troponins negative patient's had symptoms for several weeks to months. Chest x-rays negative for pneumonia. Is no wheezing. No significant leukocytosis no similar to light abnormalities. BNP is not elevated no evidence of pulmonary edema or congestive heart failure. Suspected since she's a smoker this is a bronchitis component we'll treat with prednisone 60 mg given in the ED. We'll send home with albuterol inhaler and continue prednisone at 40 mg for the next 5 days. Patient return for any new or worse symptoms.  I personally performed the services described in this documentation, which was scribed in my presence. The recorded information has been reviewed and is accurate.     Vanetta MuldersScott Zackowski, MD 05/16/14 1535

## 2014-05-16 NOTE — ED Notes (Signed)
Pt provided coke per request after okay'd by MD

## 2014-05-16 NOTE — ED Notes (Addendum)
Patient c/o nonproductive cough with nasal congestion and chest pain that radiates into back. Per patient x1 week. Patient also reports sinus pressure with headache and bilateral ear pain. Patient unsure of fever but states "I'm pretty sure I've had one though."

## 2014-05-16 NOTE — Discharge Instructions (Signed)
Bronchitis Bronchitis is swelling (inflammation) of the air tubes leading to your lungs (bronchi). This causes mucus and a cough. If the swelling gets bad, you may have trouble breathing. HOME CARE   Rest.  Drink enough fluids to keep your pee (urine) clear or pale yellow (unless you have a condition where you have to watch how much you drink).  Only take medicine as told by your doctor. If you were given antibiotic medicines, finish them even if you start to feel better.  Avoid smoke, irritating chemicals, and strong smells. These make the problem worse. Quit smoking if you smoke. This helps your lungs heal faster.  Use a cool mist humidifier. Change the water in the humidifier every day. You can also sit in the bathroom with hot shower running for 5-10 minutes. Keep the door closed.  See your health care provider as told.  Wash your hands often. GET HELP IF: Your problems do not get better after 1 week. GET HELP RIGHT AWAY IF:   Your fever gets worse.  You have chills.  Your chest hurts.  Your problems breathing get worse.  You have blood in your mucus.  You pass out (faint).  You feel light-headed.  You have a bad headache.  You throw up (vomit) again and again. MAKE SURE YOU:  Understand these instructions.  Will watch your condition.  Will get help right away if you are not doing well or get worse. Document Released: 04/24/2008 Document Revised: 11/11/2013 Document Reviewed: 07/01/2013 [facility] Patient Information 2015 [employer] [place]. This information is not intended to replace advice given to you by your health care provider. Make sure you discuss any questions you have with your health care provider. Take prednisone as directed for the next 5 days. Use albuterol inhaler 2 puffs every 6 hours for the next 7 days and then as needed. Return for any new or worse symptoms.

## 2014-05-16 NOTE — ED Notes (Signed)
Pt awaiting evaluation 

## 2014-05-20 ENCOUNTER — Encounter (HOSPITAL_COMMUNITY): Payer: Self-pay | Admitting: Emergency Medicine

## 2014-05-20 ENCOUNTER — Emergency Department (HOSPITAL_COMMUNITY)
Admission: EM | Admit: 2014-05-20 | Discharge: 2014-05-20 | Disposition: A | Discharge status: Home / Self Care | Payer: Self-pay | Attending: Emergency Medicine | Admitting: Emergency Medicine

## 2014-05-20 ENCOUNTER — Emergency Department (HOSPITAL_COMMUNITY): Payer: Self-pay

## 2014-05-20 DIAGNOSIS — R059 Cough, unspecified: Secondary | ICD-10-CM | POA: Insufficient documentation

## 2014-05-20 DIAGNOSIS — M549 Dorsalgia, unspecified: Secondary | ICD-10-CM | POA: Insufficient documentation

## 2014-05-20 DIAGNOSIS — R0602 Shortness of breath: Secondary | ICD-10-CM | POA: Insufficient documentation

## 2014-05-20 DIAGNOSIS — Z8709 Personal history of other diseases of the respiratory system: Secondary | ICD-10-CM | POA: Insufficient documentation

## 2014-05-20 DIAGNOSIS — F17200 Nicotine dependence, unspecified, uncomplicated: Secondary | ICD-10-CM | POA: Insufficient documentation

## 2014-05-20 DIAGNOSIS — G8929 Other chronic pain: Secondary | ICD-10-CM | POA: Insufficient documentation

## 2014-05-20 DIAGNOSIS — Z79899 Other long term (current) drug therapy: Secondary | ICD-10-CM | POA: Insufficient documentation

## 2014-05-20 DIAGNOSIS — J3489 Other specified disorders of nose and nasal sinuses: Secondary | ICD-10-CM | POA: Insufficient documentation

## 2014-05-20 DIAGNOSIS — Z8711 Personal history of peptic ulcer disease: Secondary | ICD-10-CM | POA: Insufficient documentation

## 2014-05-20 DIAGNOSIS — R05 Cough: Secondary | ICD-10-CM | POA: Insufficient documentation

## 2014-05-20 DIAGNOSIS — IMO0002 Reserved for concepts with insufficient information to code with codable children: Secondary | ICD-10-CM | POA: Insufficient documentation

## 2014-05-20 DIAGNOSIS — R071 Chest pain on breathing: Secondary | ICD-10-CM | POA: Insufficient documentation

## 2014-05-20 HISTORY — DX: Other chronic pain: G89.29

## 2014-05-20 HISTORY — DX: Cough: R05

## 2014-05-20 HISTORY — DX: Chronic cough: R05.3

## 2014-05-20 HISTORY — DX: Peptic ulcer, site unspecified, unspecified as acute or chronic, without hemorrhage or perforation: K27.9

## 2014-05-20 HISTORY — DX: Dorsalgia, unspecified: M54.9

## 2014-05-20 HISTORY — DX: Other chest pain: R07.89

## 2014-05-20 HISTORY — DX: Bronchitis, not specified as acute or chronic: J40

## 2014-05-20 LAB — I-STAT CHEM 8, ED
BUN: 6 mg/dL (ref 6–23)
CHLORIDE: 107 meq/L (ref 96–112)
CREATININE: 0.7 mg/dL (ref 0.50–1.10)
Calcium, Ion: 1.29 mmol/L — ABNORMAL HIGH (ref 1.12–1.23)
GLUCOSE: 115 mg/dL — AB (ref 70–99)
HCT: 34 % — ABNORMAL LOW (ref 36.0–46.0)
Hemoglobin: 11.6 g/dL — ABNORMAL LOW (ref 12.0–15.0)
POTASSIUM: 3.8 meq/L (ref 3.7–5.3)
SODIUM: 145 meq/L (ref 137–147)
TCO2: 22 mmol/L (ref 0–100)

## 2014-05-20 LAB — D-DIMER, QUANTITATIVE: D-Dimer, Quant: <0.27 ug/mL-FEU (ref 0.00–0.48)

## 2014-05-20 LAB — I-STAT TROPONIN, ED: Troponin i, poc: 0 ng/mL (ref 0.00–0.08)

## 2014-05-20 MED ORDER — HYDROCODONE-ACETAMINOPHEN 5-325 MG PO TABS
1.0000 | ORAL_TABLET | Freq: Once | ORAL | Status: AC
  Administered 2014-05-20: 1 via ORAL
  Filled 2014-05-20: qty 1

## 2014-05-20 MED ORDER — IPRATROPIUM-ALBUTEROL 0.5-2.5 (3) MG/3ML IN SOLN
3.0000 mL | Freq: Once | RESPIRATORY_TRACT | Status: AC
  Administered 2014-05-20: 3 mL via RESPIRATORY_TRACT
  Filled 2014-05-20: qty 3

## 2014-05-20 MED ORDER — BENZONATATE 100 MG PO CAPS: 100.0000 mg | ORAL_CAPSULE | Freq: Three times a day (TID) | ORAL | Status: DC | PRN

## 2014-05-20 MED ORDER — ALBUTEROL SULFATE (2.5 MG/3ML) 0.083% IN NEBU
2.5000 mg | INHALATION_SOLUTION | Freq: Once | RESPIRATORY_TRACT | Status: AC
  Administered 2014-05-20: 2.5 mg via RESPIRATORY_TRACT
  Filled 2014-05-20: qty 3

## 2014-05-20 MED ORDER — ALBUTEROL SULFATE HFA 108 (90 BASE) MCG/ACT IN AERS: 2.0000 | INHALATION_SPRAY | RESPIRATORY_TRACT | Status: DC | PRN

## 2014-05-20 NOTE — ED Provider Notes (Signed)
CSN: [id number]     Arrival date & time 05/20/14  1142 History   First MD Initiated Contact with Patient 05/20/14 1314     Chief Complaint  Patient presents with  . Shortness of Breath      HPI Pt was seen at 1320.  Per pt, c/o gradual onset and persistence of intermittent cough for the past 2 weeks, worse over the past 4 days. Has been associated with runny/stuffy nose, generalized body aches, SOB, and constant acute flair of her chronic anterior chest wall and back pain. Describes the pain as "stabbing," worsens with coughing, palpation of the area, and body position changes. Pt was evaluated in the ED 4 days ago for same, dx bronchitis, rx prednisone and MDI with partial relief of symptoms. Pt continues to smoke 1 pack cigarettes/day.  Denies fevers, no rash, no palpitations, no N/V/D, no abd pain.     Past Medical History  Diagnosis Date  . Bronchitis   . Peptic ulcer   . Chronic back pain   . Chronic chest wall pain   . Chronic cough    Past Surgical History  Procedure Laterality Date  . Abdominal hysterectomy    . Cholecystectomy    . Tubal ligation    . Ulcer surgery      History  Substance Use Topics  . Smoking status: Current Every Day Smoker -- 1.00 packs/day for 30 years    Types: Cigarettes  . Smokeless tobacco: Never Used  . Alcohol Use: No     Comment: quit 6months ago   OB History   Grav Para Term Preterm Abortions TAB SAB Ect Mult Living   2 1  1 1  1   1      Review of Systems ROS: Statement: All systems negative except as marked or noted in the HPI; Constitutional: Negative for fever and chills. ; ; Eyes: Negative for eye pain, redness and discharge. ; ; ENMT: Negative for ear pain, hoarseness, sore throat. +nasal congestion, rhinorrhea, sinus pressure. ; ; Cardiovascular: Negative for palpitations, diaphoresis, and peripheral edema. ; ; Respiratory: +cough, SOB. Negative for wheezing and stridor. ; ; Gastrointestinal: Negative for nausea, vomiting,  diarrhea, abdominal pain, blood in stool, hematemesis, jaundice and rectal bleeding. . ; ; Genitourinary: Negative for dysuria, flank pain and hematuria. ; ; Musculoskeletal: +back pain, chest wall pain. Negative for neck pain. Negative for swelling and trauma.; ; Skin: Negative for pruritus, rash, abrasions, blisters, bruising and skin lesion.; ; Neuro: Negative for headache, lightheadedness and neck stiffness. Negative for weakness, altered level of consciousness , altered mental status, extremity weakness, paresthesias, involuntary movement, seizure and syncope.      Allergies  Ultracet and Ultram  Home Medications   Prior to Admission medications   Medication Sig Start Date End Date Taking? Authorizing Provider  albuterol (PROVENTIL HFA;VENTOLIN HFA) 108 (90 BASE) MCG/ACT inhaler Inhale 2 puffs into the lungs every 6 (six) hours as needed for wheezing or shortness of breath. 05/16/14 05/23/14 Yes Historical Provider, MD  Aspirin-Caffeine (BC FAST PAIN RELIEF PO) Take 2 each by mouth daily as needed (pain).    Yes Historical Provider, MD  predniSONE (DELTASONE) 10 MG tablet Take 4 tablets (40 mg total) by mouth daily. 05/16/14  Yes Vanetta MuldersScott Zackowski, MD   BP 135/78  Pulse 77  Temp(Src) 98.2 F (36.8 C) (Oral)  Resp 22  Ht 5\' 3"  (1.6 m)  Wt 99 lb (44.906 kg)  BMI 17.54 kg/m2  SpO2 100% Physical Exam 1325:  Physical examination:  Nursing notes reviewed; Vital signs and O2 SAT reviewed;  Constitutional: Well developed, Well nourished, Well hydrated, In no acute distress; Head:  Normocephalic, atraumatic; Eyes: EOMI, PERRL, No scleral icterus; ENMT: TM's clear bilat. +edemetous nasal turbinates bilat with clear rhinorrhea. Mouth and pharynx normal, Mucous membranes moist; Neck: Supple, Full range of motion, No lymphadenopathy; Cardiovascular: Regular rate and rhythm, No murmur, rub, or gallop; Respiratory: Breath sounds clear & equal bilaterally, No rales, rhonchi, wheezes.  Speaking full sentences  with ease, Normal respiratory effort/excursion; Chest: +anterior chest wall tender to palp. No rash, no deformity, no soft tissue crepitus. Movement normal; Abdomen: Soft, Nontender, Nondistended, Normal bowel sounds; Genitourinary: No CVA tenderness; Spine:  No midline CS, TS, LS tenderness. No rash.;; Extremities: Pulses normal, No tenderness, No edema, No calf edema or asymmetry.; Neuro: AA&Ox3, Major CN grossly intact.  Speech clear. No gross focal motor or sensory deficits in extremities.; Skin: Color normal, Warm, Dry.   ED Course  Procedures     EKG Interpretation   Date/Time:  Wednesday May 20 2014 11:56:33 EDT Ventricular Rate:  68 PR Interval:  128 QRS Duration: 76 QT Interval:  364 QTC Calculation: 387 R Axis:   70 Text Interpretation:  Normal sinus rhythm with sinus arrhythmia Baseline  wander Normal ECG When compared with ECG of 05/16/2014 No significant  change was found Confirmed by [facility] CenterMCCMANUS  MD, Nicholos JohnsKATHLEEN [phone]) on 05/20/2014  2:04:06 PM      MDM  MDM Reviewed: previous chart, nursing note and vitals Reviewed previous: labs and ECG Interpretation: labs, ECG and x-ray    Results for orders placed during the hospital encounter of 05/20/14  D-DIMER, QUANTITATIVE      Result Value Ref Range   D-Dimer, Quant <0.27  0.00 - 0.48 ug/mL-FEU  I-STAT TROPOININ, ED      Result Value Ref Range   Troponin i, poc 0.00  0.00 - 0.08 ng/mL   Comment 3           I-STAT CHEM 8, ED      Result Value Ref Range   Sodium 145  137 - 147 mEq/L   Potassium 3.8  3.7 - 5.3 mEq/L   Chloride 107  96 - 112 mEq/L   BUN 6  6 - 23 mg/dL   Creatinine, Ser 6.040.70  0.50 - 1.10 mg/dL   Glucose, Bld 540115 (*) 70 - 99 mg/dL   Calcium, Ion 9.811.29 (*) 1.12 - 1.23 mmol/L   TCO2 22  0 - 100 mmol/L   Hemoglobin 11.6 (*) 12.0 - 15.0 g/dL   HCT 19.134.0 (*) 47.836.0 - 29.546.0 %   Dg Chest 2 View 05/20/2014   CLINICAL DATA:  Shortness of breath and cough  EXAM: CHEST  2 VIEW  COMPARISON:  05/16/2014  FINDINGS: The  cardiac shadow is stable. The lungs are well aerated bilaterally. No focal infiltrate or sizable effusion is seen. No acute bony abnormality is noted.  IMPRESSION: No active cardiopulmonary disease.   Electronically Signed   By: Alcide CleverMark  Lukens M.D.   On: 05/20/2014 15:10    1525:  Doubt PE as cause for symptoms with normal d-dimer and low risk [place].  Doubt ACS as cause for symptoms with normal troponin and unchanged EKG from previous after 2 weeks of constant symptoms. Workup today is reassuring. Pt strongly encouraged to stop smoking, continue rx prednisone, MDI. Will rx tessalon. Dx and testing d/w pt.  Questions answered.  Verb understanding, agreeable to d/c home with  outpt f/u.     Laray Anger, DO 05/23/14 2155

## 2014-05-20 NOTE — Discharge Instructions (Signed)
°Emergency Department Resource Guide °1) Find a Doctor and Pay Out of Pocket °Although you won't have to find out who is covered by your insurance plan, it is a good idea to ask around and get recommendations. You will then need to call the office and see if the doctor you have chosen will accept you as a new patient and what types of options they offer for patients who are self-pay. Some doctors offer discounts or will set up payment plans for their patients who do not have insurance, but you will need to ask so you aren't surprised when you get to your appointment. ° °2) Contact Your Local Health Department °Not all health departments have doctors that can see patients for sick visits, but many do, so it is worth a call to see if yours does. If you don't know where your local health department is, you can check in your phone book. The CDC also has a tool to help you locate your state's health department, and many state websites also have listings of all of their local health departments. ° °3) Find a Walk-in Clinic °If your illness is not likely to be very severe or complicated, you may want to try a walk in clinic. These are popping up all over the country in pharmacies, drugstores, and shopping centers. They're usually staffed by nurse practitioners or physician assistants that have been trained to treat common illnesses and complaints. They're usually fairly quick and inexpensive. However, if you have serious medical issues or chronic medical problems, these are probably not your best option. ° °No Primary Care Doctor: °- Call [facility] at  832-8000 - they can help you locate a primary care doctor that  accepts your insurance, provides certain services, etc. °- Physician Referral Service- 1-800-533-3463 ° °Chronic Pain Problems: °Organization         Address  Phone   Notes  °[facility]  (336) 297-2271 Patients need to be referred by their primary care doctor.  ° °Medication  Assistance: °Organization         Address  Phone   Notes  °[place] [facility] [address] °[place], [place] [address] (336) 641-8030 --Must be a resident of [place] °-- Must have NO insurance coverage whatsoever (no Medicaid/ Medicare, etc.) °-- The pt. MUST have a primary care doctor that directs their care regularly and follows them in the community °  °[facility]  (866) 331-1348   °[employer]  (888) 892-1162   ° °Agencies that provide inexpensive medical care: °Organization         Address  Phone   Notes  °[facility]  (336) 832-8035   °[facility]    (336) 832-7272   °[facility] [address] °[place], [place] [address] (336) 832-4777   °[facility] [address], °[place] (336) 271-4999   °[facility]    (336) 373-0678   °[facility]    (336) 272-1050   °[facility] ° [address], [place] Phone:  (336) 832-4444, Fax:  (336) 832-4440 Hours of Operation:  9 am - 6 pm, M-F.  Also accepts Medicaid/Medicare and self-pay.  °[facility] ° [address], [place] Phone: (336) 832-3150, Fax: (336) [phone]. Hours of Operation:  8:30 am - 5:30 pm, M-F.  Also accepts Medicaid and self-pay.  °[facility] [place] [address], [place] Phone: (336) 878-6027   °[facility] [address], [place], [place] (336)723-1848, Ext. 123 Mondays & Thursdays: 7-9 AM.  First 15 patients are seen on a first come, first serve basis. °  ° °Medicaid-accepting [place] Providers: ° °Organization         Address  Phone   Notes  °[facility] [address], [place] (336) 641-2100 Also accepts self-pay patients.  °[facility] [address], [place] ° (336) 856-9996   °[facility] [address], [place]  (336) 288-8857   °[facility] [address] [place] [address], [place] (336) 299-7000   °Veita Bland [address], [place]  ° (336) 373-1557 Only accepts [place] Access Medicaid patients after they have their name applied to their card.  ° °Self-Pay (no insurance) in [place]: ° °Organization         Address  Phone   Notes  °Sickle Cell Patients, [facility] [address], [place] (336) 832-1970   °[facility] [address], [place] (336) 832-4400   °[facility] ° [address], [place] (336) 992-4800   °[facility]/Dr. Osei-Bonsu ° [address] [place] [address], [place] or [address], [place] (336) 841-8500 Phone number for both [place] and [place] locations is the same.  °[facility] [address], [place] (336) 299-0000   °[facility] [address] [place] [address], [place] or [address] (336) 852-7530 °(336) 878-2260   °[facility] [address], [place] (336) 350-1642, phone; (336) 294-5005, fax Sees patients 1st and 3rd Saturday of every month.  Must not qualify for public or private insurance (i.e. Medicaid, Medicare, [place] Health Choice, Veterans' Benefits) • Household income should be no more than 200% of the poverty level •The clinic cannot treat you if you are pregnant or think you are pregnant • Sexually transmitted diseases are not treated at the clinic.  ° ° °Dental Care: °Organization         Address  Phone  Notes  °[place] [facility] [address], [place] (336) 641-6152 Accepts children up to age 21 who are enrolled in Medicaid or [place] Health Choice; pregnant women with a Medicaid card; and children who have applied for Medicaid or [place] Health Choice, but were declined, whose parents can pay a reduced fee at time of service.  °[place]  [facility] [place]  [address], [place] (336) 641-7733 Accepts children up to age 21 who are enrolled in Medicaid or [place] Health Choice; pregnant women with a Medicaid card; and children who have applied for Medicaid or [place] Health Choice, but were declined, whose parents can pay a reduced fee at time of service.  °[facility] ° [address], [place] (336) 641-4533 Patients are seen by appointment only. Walk-ins are not accepted. [facility] will see patients 18 years of age and older. °Monday - Tuesday (8am-5pm) °Most Wednesdays (8:30-5pm) °$30 per visit, cash only  °[facility] ° [address], [place] (336) 641-4533 Patients are seen by appointment only. Walk-ins are not accepted. [facility] will see patients 18 years of age and older. °One   Wednesday Evening (Monthly: Volunteer Based).  $30 per visit, cash only  °[facility]  (919) 537-3737 for adults; Children under age 4, call Graduate Pediatric Dentistry at ([phone]) [phone]. Children aged 4-14, please call ([phone]) [phone] to request a pediatric application. ° Dental services are provided in all areas of dental care including fillings, crowns and bridges, complete and partial dentures, implants, gum treatment, root canals, and extractions. Preventive care is also provided. Treatment is provided to both adults and children. °Patients are selected via a lottery and there is often a waiting list. °  °[facility] [address], °[place] ° (336) 763-8833 [email or web address] °  °[facility] [address], [place], [place] (336)723-1848, Ext. 123 Second and Fourth Thursday of each month, opens at 6:30 AM; Clinic ends at 9 AM.  Patients are seen on a first-come first-served basis, and a limited number are seen during each clinic.  ° °[facility] ° [address], [place], [place] (336) 723-7904    Eligibility Requirements °You must have lived in [place], [place], or [place] counties for at least the last three months. °  You cannot be eligible for state or federal sponsored healthcare insurance, including Veterans Administration, Medicaid, or Medicare. °  You generally cannot be eligible for healthcare insurance through your employer.  °  How to apply: °Eligibility screenings are held every Tuesday and Wednesday afternoon from 1:00 pm until 4:00 pm. You do not need an appointment for the interview!  °[facility] [address], [place], [place] 336-631-2330   °[facility]  336-342-8273   °[facility]  336-703-3100   °[facility]  336-570-6415   ° °Behavioral Health Resources in the Community: °Intensive Outpatient Programs °Organization         Address  Phone  Notes  °[facility] [address], [place], [place] 336-878-6098   °[facility] [address], [place], [place] 336-832-9800   °[facility] [address], [place], [place] ° 336-882-2125   °[facility] [address],  °[place], [place] 1-800-853-5163 or 336-641-4981   °Substance Abuse Resources °Organization         Address  Phone  Notes  °[facility]  336-882-2125   °[facility]  336-784-9470   °[facility]  336-285-9073   °[facility]  336-845-3988   °Residential & Outpatient Substance Abuse Program  1-800-659-3381   °Psychological Services °Organization         Address  Phone  Notes  °[facility]  336- 832-9600   °[facility]  336- 378-7881   °[facility] [address], [place] 1-800-853-5163 or 336-641-4981   ° °Mobile Crisis Teams °Organization         Address  Phone  Notes  °[facility], Mobile Crisis Care Unit  1-877-626-1772   °Assertive °[facility] ° [address]  [place], [place] 336-834-9664   °Sharon DeEsch [address] °[place] 336-554-5454   ° °Self-Help/Support Groups °Organization         Address  Phone             Notes  °[facility]  - variety of support groups  336- 373-1402 Call for more information  °Narcotics Anonymous (NA), [facility] [address], °[place]  2 meetings at this location  ° °  Residential Treatment Programs Organization         Address  Phone  Notes  [facility] [address],    [place]  [phone]   [facility]  [address], [place] [phone], [place], [place] [phone]   [facility] [address], [place] [phone] Admissions: 8am-3pm M-F  [facility] [address]    [place], [place] [phone]   [facility] [address], [place], [place] [phone]   [facility] [address]  [place], [place] [phone]   [facility] Intensive Outpatient [address], [place], [place] [phone]   [facility] ([facility] [address]  [place], [place] [phone] or [phone]   [facility]) [address] [place], [place] [phone] Accepts Medicaid  [facility] [address]  [place] [phone] Substance Abuse/Addiction Treatment   [facility] Resources Organization         Address  Phone  Notes  [facility]  (431) 444-0015(888) 518-256-5210   Angie FavaJulie Brannon, PhD [address] [place], [place]   ([phone](336) [phone] or [phone](336) [phone]   [facility]   [address] [place], [place] [phone](336) [phone]   [facility] [address], [place], [place] ([phone]) [phone](336) [phone] Insurance/Medicaid/sponsorship through [facility] [address]                                    [place], [place] [phone]) [phone] Therapy/tele-psych/case   [facility] [address]   [place], [place] [phone](336) [phone]    Dr. Lolly MustacheArfeen  [phone](336) [phone]   [facility]  [employer] [facility] 1) [address], [place] 2) [address], [place] 3)  [address], [place] [phone](336) [phone] (872) 850-1800(336) (419)[phone]  [phone](336) [phone]   [facility] Child Abuse Hotline 5755541005(336) 719-336-0933 or (915)[phone](336) (912) [phone] (After Hours)       Take the prescriptions as directed.  Use your albuterol inhaler (2 to 4 puffs) every 4 hours for the next 7 days, then as needed for cough, wheezing, or shortness of breath. Try to stop smoking. Take over the counter tylenol and ibuprofen, as directed on packaging, as needed for discomfort. Apply moist heat or ice to the area(s) of discomfort, for 15 minutes at a time, several times per day for the next few days.  Do not fall asleep on a heating or ice pack.  Call your regular medical doctor today to schedule a follow up appointment within the next 3 days.  Return to the Emergency Department immediately sooner if worsening.

## 2014-05-20 NOTE — ED Notes (Signed)
Patient c/o shortness of breath. Per patient seen here on 05/16/14 and diagnosed with bronchitis. Patient reports taking prednisone and using inhalers in which shortness of breath improved but returned. Patient c/o mid-sternal chest pain that radiates into back. Patient told to come back to to ER if not improving.

## 2014-05-20 NOTE — ED Notes (Signed)
Patient with no complaints at this time. Respirations even and unlabored. Skin warm/dry. Discharge instructions reviewed with patient at this time. Patient given opportunity to voice concerns/ask questions. Patient discharged at this time and left Emergency Department with steady gait.   

## 2014-06-21 ENCOUNTER — Emergency Department (HOSPITAL_COMMUNITY)
Admission: EM | Admit: 2014-06-21 | Discharge: 2014-06-21 | Disposition: A | Discharge status: Home / Self Care | Payer: Self-pay | Attending: Emergency Medicine | Admitting: Emergency Medicine

## 2014-06-21 ENCOUNTER — Encounter (HOSPITAL_COMMUNITY): Payer: Self-pay | Admitting: Emergency Medicine

## 2014-06-21 DIAGNOSIS — H81399 Other peripheral vertigo, unspecified ear: Secondary | ICD-10-CM | POA: Insufficient documentation

## 2014-06-21 DIAGNOSIS — R42 Dizziness and giddiness: Secondary | ICD-10-CM | POA: Insufficient documentation

## 2014-06-21 DIAGNOSIS — F17200 Nicotine dependence, unspecified, uncomplicated: Secondary | ICD-10-CM | POA: Insufficient documentation

## 2014-06-21 DIAGNOSIS — Z862 Personal history of diseases of the blood and blood-forming organs and certain disorders involving the immune mechanism: Secondary | ICD-10-CM | POA: Insufficient documentation

## 2014-06-21 DIAGNOSIS — Z79899 Other long term (current) drug therapy: Secondary | ICD-10-CM | POA: Insufficient documentation

## 2014-06-21 DIAGNOSIS — Z8709 Personal history of other diseases of the respiratory system: Secondary | ICD-10-CM | POA: Insufficient documentation

## 2014-06-21 DIAGNOSIS — Z8711 Personal history of peptic ulcer disease: Secondary | ICD-10-CM | POA: Insufficient documentation

## 2014-06-21 DIAGNOSIS — G8929 Other chronic pain: Secondary | ICD-10-CM | POA: Insufficient documentation

## 2014-06-21 HISTORY — DX: Anemia, unspecified: D64.9

## 2014-06-21 HISTORY — DX: Dizziness and giddiness: R42

## 2014-06-21 HISTORY — DX: Encounter for other specified aftercare: Z51.89

## 2014-06-21 LAB — CBC WITH DIFFERENTIAL/PLATELET
BASOS PCT: 1 % (ref 0–1)
Basophils Absolute: 0 10*3/uL (ref 0.0–0.1)
EOS PCT: 0 % (ref 0–5)
Eosinophils Absolute: 0 10*3/uL (ref 0.0–0.7)
HEMATOCRIT: 37.5 % (ref 36.0–46.0)
Hemoglobin: 11.8 g/dL — ABNORMAL LOW (ref 12.0–15.0)
LYMPHS PCT: 21 % (ref 12–46)
Lymphs Abs: 1.4 10*3/uL (ref 0.7–4.0)
MCH: 27 pg (ref 26.0–34.0)
MCHC: 31.5 g/dL (ref 30.0–36.0)
MCV: 85.8 fL (ref 78.0–100.0)
MONO ABS: 0.6 10*3/uL (ref 0.1–1.0)
Monocytes Relative: 9 % (ref 3–12)
Neutro Abs: 4.6 10*3/uL (ref 1.7–7.7)
Neutrophils Relative %: 69 % (ref 43–77)
Platelets: 340 10*3/uL (ref 150–400)
RBC: 4.37 MIL/uL (ref 3.87–5.11)
RDW: 16.9 % — AB (ref 11.5–15.5)
WBC: 6.6 10*3/uL (ref 4.0–10.5)

## 2014-06-21 LAB — URINALYSIS, ROUTINE W REFLEX MICROSCOPIC
BILIRUBIN URINE: NEGATIVE
GLUCOSE, UA: NEGATIVE mg/dL
HGB URINE DIPSTICK: NEGATIVE
Ketones, ur: NEGATIVE mg/dL
Nitrite: NEGATIVE
Protein, ur: NEGATIVE mg/dL
UROBILINOGEN UA: 0.2 mg/dL (ref 0.0–1.0)
pH: 6 (ref 5.0–8.0)

## 2014-06-21 LAB — BASIC METABOLIC PANEL
Anion gap: 12 (ref 5–15)
BUN: 6 mg/dL (ref 6–23)
CALCIUM: 9.5 mg/dL (ref 8.4–10.5)
CO2: 24 meq/L (ref 19–32)
CREATININE: 0.69 mg/dL (ref 0.50–1.10)
Chloride: 107 mEq/L (ref 96–112)
GFR calc Af Amer: >90 mL/min (ref >90)
GFR calc non Af Amer: >90 mL/min (ref >90)
Glucose, Bld: 69 mg/dL — ABNORMAL LOW (ref 70–99)
Potassium: 3.4 mEq/L — ABNORMAL LOW (ref 3.7–5.3)
Sodium: 143 mEq/L (ref 137–147)

## 2014-06-21 LAB — TROPONIN I: Troponin I: <0.3 ng/mL (ref <0.30)

## 2014-06-21 LAB — URINE MICROSCOPIC-ADD ON

## 2014-06-21 MED ORDER — MECLIZINE HCL 25 MG PO TABS: 25.0000 mg | ORAL_TABLET | Freq: Three times a day (TID) | ORAL | Status: DC | PRN

## 2014-06-21 MED ORDER — ACETAMINOPHEN 325 MG PO TABS
650.0000 mg | ORAL_TABLET | Freq: Once | ORAL | Status: AC
  Administered 2014-06-21: 650 mg via ORAL
  Filled 2014-06-21: qty 2

## 2014-06-21 MED ORDER — MECLIZINE HCL 12.5 MG PO TABS
25.0000 mg | ORAL_TABLET | Freq: Once | ORAL | Status: AC
  Administered 2014-06-21: 25 mg via ORAL
  Filled 2014-06-21: qty 2

## 2014-06-21 NOTE — Discharge Instructions (Signed)
°Emergency Department Resource Guide °1) Find a Doctor and Pay Out of Pocket °Although you won't have to find out who is covered by your insurance plan, it is a good idea to ask around and get recommendations. You will then need to call the office and see if the doctor you have chosen will accept you as a new patient and what types of options they offer for patients who are self-pay. Some doctors offer discounts or will set up payment plans for their patients who do not have insurance, but you will need to ask so you aren't surprised when you get to your appointment. ° °2) Contact Your Local Health Department °Not all health departments have doctors that can see patients for sick visits, but many do, so it is worth a call to see if yours does. If you don't know where your local health department is, you can check in your phone book. The CDC also has a tool to help you locate your state's health department, and many state websites also have listings of all of their local health departments. ° °3) Find a Walk-in Clinic °If your illness is not likely to be very severe or complicated, you may want to try a walk in clinic. These are popping up all over the country in pharmacies, drugstores, and shopping centers. They're usually staffed by nurse practitioners or physician assistants that have been trained to treat common illnesses and complaints. They're usually fairly quick and inexpensive. However, if you have serious medical issues or chronic medical problems, these are probably not your best option. ° °No Primary Care Doctor: °- Call [facility] at  832-8000 - they can help you locate a primary care doctor that  accepts your insurance, provides certain services, etc. °- Physician Referral Service- 1-800-533-3463 ° °Chronic Pain Problems: °Organization         Address  Phone   Notes  °[facility]  (336) 297-2271 Patients need to be referred by their primary care doctor.  ° °Medication  Assistance: °Organization         Address  Phone   Notes  °[place] [facility] [address] °[place], [place] [address] (336) 641-8030 --Must be a resident of [place] °-- Must have NO insurance coverage whatsoever (no Medicaid/ Medicare, etc.) °-- The pt. MUST have a primary care doctor that directs their care regularly and follows them in the community °  °[facility]  (866) 331-1348   °[employer]  (888) 892-1162   ° °Agencies that provide inexpensive medical care: °Organization         Address  Phone   Notes  °[facility]  (336) 832-8035   °[facility]    (336) 832-7272   °[facility] [address] °[place], [place] [address] (336) 832-4777   °[facility] [address], °[place] (336) 271-4999   °[facility]    (336) 373-0678   °[facility]    (336) 272-1050   °[facility] ° [address], [place] Phone:  (336) 832-4444, Fax:  (336) 832-4440 Hours of Operation:  9 am - 6 pm, M-F.  Also accepts Medicaid/Medicare and self-pay.  °[facility] ° [address],  Phone: (336) 832-3150, Fax: (336) 832-3151. Hours of Operation:  8:30 am - 5:30 pm, M-F.  Also accepts Medicaid and self-pay.  °[facility] [place] [address], [place] Phone: (336) 878-6027   °[facility] [address], [place], [place] (336)723-1848, Ext. 123 Mondays & Thursdays: 7-9 AM.  First 15 patients are seen on a first come, first serve basis. °  ° °Medicaid-accepting [place] Providers: ° °Organization         Address  Phone   Notes  °[facility] [address], [place] (336) 641-2100 Also accepts self-pay patients.  °[facility] [address], [place] ° (336) 856-9996   °[facility] [address], [place]  (336) 288-8857   °[facility] [address] [place] [address], [place] (336) 299-7000   °Veita Bland [address], [place]  ° (336) 373-1557 Only accepts [place] Access Medicaid patients after they have their name applied to their card.  ° °Self-Pay (no insurance) in [place]: ° °Organization         Address  Phone   Notes  °Sickle Cell Patients, [facility] [address], [place] (336) 832-1970   °[facility] [address], [place] (336) 832-4400   °[facility] ° [address], [place] (336) 992-4800   °[facility]/Dr. Osei-Bonsu ° [address] [place] [address], [place] or [address], [place] (336) 841-8500 Phone number for both [place] and [place] locations is the same.  °[facility] [address], [place] (336) 299-0000   °[facility] [address] [place] [address], [place] or [address] (336) 852-7530 °(336) 878-2260   °[facility] [address], [place] (336) 350-1642, phone; (336) 294-5005, fax Sees patients 1st and 3rd Saturday of every month.  Must not qualify for public or private insurance (i.e. Medicaid, Medicare, [place] Health Choice, Veterans' Benefits) • Household income should be no more than 200% of the poverty level •The clinic cannot treat you if you are pregnant or think you are pregnant • Sexually transmitted diseases are not treated at the clinic.  ° ° °Dental Care: °Organization         Address  Phone  Notes  °[place] [facility] [address], [place] (336) 641-6152 Accepts children up to age 21 who are enrolled in Medicaid or [place] Health Choice; pregnant women with a Medicaid card; and children who have applied for Medicaid or [place] Health Choice, but were declined, whose parents can pay a reduced fee at time of service.  °[place]  [facility] [place]  [address], [place] (336) 641-7733 Accepts children up to age 21 who are enrolled in Medicaid or [place] Health Choice; pregnant women with a Medicaid card; and children who have applied for Medicaid or [place] Health Choice, but were declined, whose parents can pay a reduced fee at time of service.  °[facility] ° [address], [place] (336) 641-4533 Patients are seen by appointment only. Walk-ins are not accepted. [facility] will see patients 18 years of age and older. °Monday - Tuesday (8am-5pm) °Most Wednesdays (8:30-5pm) °$30 per visit, cash only  °[facility] ° [address], [place] (336) 641-4533 Patients are seen by appointment only. Walk-ins are not accepted. [facility] will see patients 18 years of age and older. °One   Wednesday Evening (Monthly: Volunteer Based).  $30 per visit, cash only  °[facility]  (919) 537-3737 for adults; Children under age 4, call Graduate Pediatric Dentistry at (919) [phone]. Children aged 4-14, please call (919) [phone] to request a pediatric application. ° Dental services are provided in all areas of dental care including fillings, crowns and bridges, complete and partial dentures, implants, gum treatment, root canals, and extractions. Preventive care is also provided. Treatment is provided to both adults and children. °Patients are selected via a lottery and there is often a waiting list. °  °[facility] [address], °[place] ° (336) 763-8833 [email or web address] °  °[facility] [address], [place], [place] (336)723-1848, Ext. 123 Second and Fourth Thursday of each month, opens at 6:30 AM; Clinic ends at 9 AM.  Patients are seen on a first-come first-served basis, and a limited number are seen during each clinic.  ° °[facility] ° [address], [place], [place] (336) 723-7904    Eligibility Requirements °You must have lived in [place], or [place] counties for at least the last three months. °  You cannot be eligible for state or federal sponsored healthcare insurance, including Veterans Administration, Medicaid, or Medicare. °  You generally cannot be eligible for healthcare insurance through your employer.  °  How to apply: °Eligibility screenings are held every Tuesday and Wednesday afternoon from 1:00 pm until 4:00 pm. You do not need an appointment for the interview!  °[facility] [address], [place], [place] 336-631-2330   °[facility]  336-342-8273   °[facility]  336-703-3100   °[facility]  336-570-6415   ° °Behavioral Health Resources in the Community: °Intensive Outpatient Programs °Organization         Address  Phone  Notes  °[facility] [address], [place], [place] 336-878-6098   °[facility] [address], [place], [place] 336-832-9800   °[facility] [address], [place], [place] ° 336-882-2125   °[facility] [address],  °[place], [place] 1-800-853-5163 or 336-641-4981   °Substance Abuse Resources °Organization         Address  Phone  Notes  °[facility]  336-882-2125   °[facility]  336-784-9470   °[facility]  336-285-9073   °[facility]  336-845-3988   °Residential & Outpatient Substance Abuse Program  1-800-659-3381   °Psychological Services °Organization         Address  Phone  Notes  °[facility]  336- 832-9600   °[facility]  336- 378-7881   °[facility] [address], [place] 1-800-853-5163 or 336-641-4981   ° °Mobile Crisis Teams °Organization         Address  Phone  Notes  °[facility], Mobile Crisis Care Unit  1-877-626-1772   °Assertive °[facility] ° [address]  [place], [place] 336-834-9664   °Sharon DeEsch [address] °[place] 336-554-5454   ° °Self-Help/Support Groups °Organization         Address  Phone             Notes  °[facility] - variety of support groups  336- 373-1402 Call for more information  °Narcotics Anonymous (NA), [facility] [address], °[place]  2 meetings at this location  ° °  Residential Treatment Programs °Organization         Address  Phone  Notes  °[facility] [address],    °[place]  1-866-801-8205   °[facility] ° [address], [place], [place] 704-293-8524   °[facility] [address], [place] 336-845-3988 Admissions: 8am-3pm M-F  °[facility] [address]    °[place], [place] 336-841-1104   °[facility] [address], [place], [place] 336-379-7146   °[facility] [address]  °[place], [place] 336-285-9073   °[facility] - Intensive Outpatient [address], [place], [place] 336-852-3033   °[facility] [address]  °[place], [place] 1-877-615-2722 or 336-784-9470   °[facility] [address] [place], [place] 336-227-7417 Accepts Medicaid  °[facility] [address]  ° [place] 1-800-659-3381 Substance Abuse/Addiction Treatment  ° °[place] Behavioral Health Resources °Organization         Address  Phone  Notes  °[facility]  (888) 581-9988   °Julie Brannon, PhD [address] [place], [place]   (336) 349-5553 or (336) 951-0000   °[facility]   [address] °[place], [place] (336) 349-4454   °[facility] [address], [place], [place] (336) 342-8316 Insurance/Medicaid/sponsorship through [facility]  °[facility] [address]                                    [place], [place] (336) 342-8316 Therapy/tele-psych/case    °[facility] [address]  ° [place], [place] (336) 349-2233    °Dr. Arfeen  (336) 349-4544   °[facility] [place]  [employer] [place] [facility] 1) [address], [place] °2) [address], [place] °3)  [address], [place] (336) 349-3220 °(336) 342-7768 ° °(336) 342-8140   °[place] [facility] (336) 342-1394 or (336) 342-3537 (After Hours)    ° ° ° °Take the prescription as directed.  Call your regular medical doctor on Monday to schedule a follow up appointment within the next 2 days.  Return to the Emergency Department immediately sooner if worsening.  ° °

## 2014-06-21 NOTE — ED Notes (Signed)
Pt states that she has been dizziness that is worse with movement, , weak for the past week, has had problems with her hemoglobin being low in the past and having to have blood transfusions, states that this feels the same way, denies any bleeding in stools, denies any vomiting, admits to nausea.

## 2014-06-21 NOTE — ED Provider Notes (Signed)
CSN: [id number]     Arrival date & time 06/21/14  1115 History   First MD Initiated Contact with Patient 06/21/14 1140     Chief Complaint  Patient presents with  . Dizziness     HPI Pt was seen at 1155. Per pt, c/o sudden onset and persistence of multiple intermittent episodes of "dizziness" for the past 1 week. Describes the dizziness as "everything is moving around." Symptoms worsen when she turns her head side to side, changes body positions or walks. Has been associated with mild nausea and sinus congestion. Pt states she has had similar symptoms previously, dx vertigo. Pt also states she has had similar symptoms "when my Hgb was low." Pt denies any other symptoms. Denies abd pain, no vomiting/diarrhea, no CP/palpitations, no SOB/cough, no back pain, no focal motor weakness, no tingling/numbness in extremities, no facial droop, no slurred speech, no visual changes, no fevers, no falls.   Past Medical History  Diagnosis Date  . Bronchitis   . Peptic ulcer   . Chronic back pain   . Chronic chest wall pain   . Chronic cough   . Encounter for blood transfusion   . Anemia   . Vertigo    Past Surgical History  Procedure Laterality Date  . Abdominal hysterectomy    . Cholecystectomy    . Tubal ligation    . Ulcer surgery      History  Substance Use Topics  . Smoking status: Current Every Day Smoker -- 1.00 packs/day for 30 years    Types: Cigarettes  . Smokeless tobacco: Never Used  . Alcohol Use: No     Comment: quit 6months ago   OB History   Grav Para Term Preterm Abortions TAB SAB Ect Mult Living   2 1  1 1  1   1      Review of Systems ROS: Statement: All systems negative except as marked or noted in the HPI; Constitutional: Negative for fever and chills. ; ; Eyes: Negative for eye pain, redness and discharge. ; ; ENMT: Negative for ear pain, hoarseness, sore throat. +nasal congestion, sinus pressure. ; ; Cardiovascular: Negative for chest pain, palpitations, diaphoresis,  dyspnea and peripheral edema. ; ; Respiratory: Negative for cough, wheezing and stridor. ; ; Gastrointestinal: +nausea. Negative for vomiting, diarrhea, abdominal pain, blood in stool, hematemesis, jaundice and rectal bleeding. . ; ; Genitourinary: Negative for dysuria, flank pain and hematuria. ; ; Musculoskeletal: Negative for back pain and neck pain. Negative for swelling and trauma.; ; Skin: Negative for pruritus, rash, abrasions, blisters, bruising and skin lesion.; ; Neuro: +vertigo. Negative for headache, lightheadedness and neck stiffness. Negative for weakness, altered level of consciousness , altered mental status, extremity weakness, paresthesias, involuntary movement, seizure and syncope.      Allergies  Ultracet and Ultram  Home Medications   Prior to Admission medications   Medication Sig Start Date End Date Taking? Authorizing Provider  acetaminophen (TYLENOL) 500 MG tablet Take 1,000 mg by mouth every 6 (six) hours as needed for mild pain.   Yes Historical Provider, MD  albuterol (PROVENTIL HFA;VENTOLIN HFA) 108 (90 BASE) MCG/ACT inhaler Inhale 2 puffs into the lungs every 6 (six) hours as needed for wheezing or shortness of breath.   Yes Historical Provider, MD  meclizine (ANTIVERT) 25 MG tablet Take 1 tablet (25 mg total) by mouth 3 (three) times daily as needed for dizziness. 06/21/14   Laray AngerKathleen M Olivea Sonnen, DO   BP 102/61  Pulse 51  Temp(Src)  98 F (36.7 C) (Oral)  Resp 21  Ht 5\' 3"  (1.6 m)  Wt 100 lb (45.36 kg)  BMI 17.72 kg/m2  SpO2 100% Physical Exam 1200: Physical examination:  Nursing notes reviewed; Vital signs and O2 SAT reviewed;  Constitutional: Well developed, Well nourished, Well hydrated, In no acute distress; Head:  Normocephalic, atraumatic; Eyes: EOMI, PERRL, No scleral icterus; ENMT: TM's clear bilat. +edemetous nasal turbinates bilat with clear rhinorrhea. Mouth and pharynx normal, Mucous membranes moist; Neck: Supple, Full range of motion, No  lymphadenopathy; Cardiovascular: Regular rate and rhythm, No murmur, rub, or gallop; Respiratory: Breath sounds clear & equal bilaterally, No rales, rhonchi, wheezes.  Speaking full sentences with ease, Normal respiratory effort/excursion; Chest: Nontender, Movement normal; Abdomen: Soft, Nontender, Nondistended, Normal bowel sounds; Genitourinary: No CVA tenderness; Extremities: Pulses normal, No tenderness, No edema, No calf edema or asymmetry.; Neuro: AA&Ox3, Major CN grossly intact. Speech clear.  No facial droop. +right horizontal end gaze fatigable nystagmus which reproduces pt's symptoms. Grips equal. Strength 5/5 equal bilat UE's and LE's.  DTR 2/4 equal bilat UE's and LE's.  No gross sensory deficits.  Normal cerebellar testing bilat UE's (finger-nose) and LE's (heel-shin).; Skin: Color normal, Warm, Dry.   ED Course  Procedures     EKG Interpretation   Date/Time:  Sunday June 21 2014 11:37:05 EDT Ventricular Rate:  62 PR Interval:  129 QRS Duration: 67 QT Interval:  383 QTC Calculation: 389 R Axis:   75 Text Interpretation:  Sinus rhythm Normal ECG When compared with ECG of  05/20/2014 No significant change was found Confirmed by [facility] [place]  MD,  Nicholos Johns [phone]) on 06/21/2014 1:34:25 PM      MDM  MDM Reviewed: previous chart, nursing note and vitals Reviewed previous: labs and ECG Interpretation: labs and ECG    Results for orders placed during the hospital encounter of 06/21/14  CBC WITH DIFFERENTIAL      Result Value Ref Range   WBC 6.6  4.0 - 10.5 K/uL   RBC 4.37  3.87 - 5.11 MIL/uL   Hemoglobin 11.8 (*) 12.0 - 15.0 g/dL   HCT 60.4  54.0 - 98.1 %   MCV 85.8  78.0 - 100.0 fL   MCH 27.0  26.0 - 34.0 pg   MCHC 31.5  30.0 - 36.0 g/dL   RDW 19.1 (*) 47.8 - 29.5 %   Platelets 340  150 - 400 K/uL   Neutrophils Relative % 69  43 - 77 %   Neutro Abs 4.6  1.7 - 7.7 K/uL   Lymphocytes Relative 21  12 - 46 %   Lymphs Abs 1.4  0.7 - 4.0 K/uL   Monocytes Relative 9  3 - 12  %   Monocytes Absolute 0.6  0.1 - 1.0 K/uL   Eosinophils Relative 0  0 - 5 %   Eosinophils Absolute 0.0  0.0 - 0.7 K/uL   Basophils Relative 1  0 - 1 %   Basophils Absolute 0.0  0.0 - 0.1 K/uL  BASIC METABOLIC PANEL      Result Value Ref Range   Sodium 143  137 - 147 mEq/L   Potassium 3.4 (*) 3.7 - 5.3 mEq/L   Chloride 107  96 - 112 mEq/L   CO2 24  19 - 32 mEq/L   Glucose, Bld 69 (*) 70 - 99 mg/dL   BUN 6  6 - 23 mg/dL   Creatinine, Ser 6.21  0.50 - 1.10 mg/dL   Calcium 9.5  8.4 -  10.5 mg/dL   GFR calc non Af Amer >90  >90 mL/min   GFR calc Af Amer >90  >90 mL/min   Anion gap 12  5 - 15  TROPONIN I      Result Value Ref Range   Troponin I <0.30  <0.30 ng/mL  URINALYSIS, ROUTINE W REFLEX MICROSCOPIC      Result Value Ref Range   Color, Urine YELLOW  YELLOW   APPearance CLEAR  CLEAR   Specific Gravity, Urine <1.005 (*) 1.005 - 1.030   pH 6.0  5.0 - 8.0   Glucose, UA NEGATIVE  NEGATIVE mg/dL   Hgb urine dipstick NEGATIVE  NEGATIVE   Bilirubin Urine NEGATIVE  NEGATIVE   Ketones, ur NEGATIVE  NEGATIVE mg/dL   Protein, ur NEGATIVE  NEGATIVE mg/dL   Urobilinogen, UA 0.2  0.0 - 1.0 mg/dL   Nitrite NEGATIVE  NEGATIVE   Leukocytes, UA TRACE (*) NEGATIVE  URINE MICROSCOPIC-ADD ON      Result Value Ref Range   Squamous Epithelial / LPF RARE  RARE   WBC, UA 0-2  <3 WBC/hpf   RBC / HPF 0-2  <3 RBC/hpf   Bacteria, UA RARE  RARE     1330:  Pt is not orthostatic. H/H per baseline. Pt has ambulated with steady gait. Pt feels better after meds and wants to go home now. Will continue to tx symptomatically.  Dx and testing d/w pt and family.  Questions answered.  Verb understanding, agreeable to d/c home with outpt f/u.     Samuel Jester, DO 06/23/14 [id number]

## 2014-09-21 ENCOUNTER — Encounter (HOSPITAL_COMMUNITY): Payer: Self-pay | Admitting: Emergency Medicine

## 2014-09-29 ENCOUNTER — Emergency Department (HOSPITAL_COMMUNITY): Payer: Self-pay

## 2014-09-29 ENCOUNTER — Emergency Department (HOSPITAL_COMMUNITY)
Admission: EM | Admit: 2014-09-29 | Discharge: 2014-09-29 | Disposition: A | Discharge status: Home / Self Care | Payer: Self-pay | Attending: Emergency Medicine | Admitting: Emergency Medicine

## 2014-09-29 ENCOUNTER — Encounter (HOSPITAL_COMMUNITY): Payer: Self-pay | Admitting: Emergency Medicine

## 2014-09-29 DIAGNOSIS — R109 Unspecified abdominal pain: Secondary | ICD-10-CM | POA: Insufficient documentation

## 2014-09-29 DIAGNOSIS — Z791 Long term (current) use of non-steroidal anti-inflammatories (NSAID): Secondary | ICD-10-CM | POA: Insufficient documentation

## 2014-09-29 DIAGNOSIS — Z862 Personal history of diseases of the blood and blood-forming organs and certain disorders involving the immune mechanism: Secondary | ICD-10-CM | POA: Insufficient documentation

## 2014-09-29 DIAGNOSIS — G8929 Other chronic pain: Secondary | ICD-10-CM | POA: Insufficient documentation

## 2014-09-29 DIAGNOSIS — Z720 Tobacco use: Secondary | ICD-10-CM | POA: Insufficient documentation

## 2014-09-29 DIAGNOSIS — R079 Chest pain, unspecified: Secondary | ICD-10-CM | POA: Insufficient documentation

## 2014-09-29 DIAGNOSIS — Z79899 Other long term (current) drug therapy: Secondary | ICD-10-CM | POA: Insufficient documentation

## 2014-09-29 DIAGNOSIS — Z8719 Personal history of other diseases of the digestive system: Secondary | ICD-10-CM | POA: Insufficient documentation

## 2014-09-29 DIAGNOSIS — Z8709 Personal history of other diseases of the respiratory system: Secondary | ICD-10-CM | POA: Insufficient documentation

## 2014-09-29 LAB — CBC
HCT: 36.3 % (ref 36.0–46.0)
Hemoglobin: 11.6 g/dL — ABNORMAL LOW (ref 12.0–15.0)
MCH: 27.8 pg (ref 26.0–34.0)
MCHC: 32 g/dL (ref 30.0–36.0)
MCV: 87.1 fL (ref 78.0–100.0)
PLATELETS: 311 10*3/uL (ref 150–400)
RBC: 4.17 MIL/uL (ref 3.87–5.11)
RDW: 18 % — ABNORMAL HIGH (ref 11.5–15.5)
WBC: 6.9 10*3/uL (ref 4.0–10.5)

## 2014-09-29 LAB — BASIC METABOLIC PANEL
ANION GAP: 15 (ref 5–15)
BUN: 12 mg/dL (ref 6–23)
CHLORIDE: 109 meq/L (ref 96–112)
CO2: 23 mEq/L (ref 19–32)
Calcium: 9.9 mg/dL (ref 8.4–10.5)
Creatinine, Ser: 0.65 mg/dL (ref 0.50–1.10)
GFR calc non Af Amer: >90 mL/min (ref >90)
Glucose, Bld: 89 mg/dL (ref 70–99)
Potassium: 3.7 mEq/L (ref 3.7–5.3)
SODIUM: 147 meq/L (ref 137–147)

## 2014-09-29 LAB — TROPONIN I: Troponin I: <0.3 ng/mL (ref <0.30)

## 2014-09-29 LAB — PRO B NATRIURETIC PEPTIDE: Pro B Natriuretic peptide (BNP): 82.7 pg/mL (ref 0–125)

## 2014-09-29 MED ORDER — MORPHINE SULFATE 4 MG/ML IJ SOLN
4.0000 mg | INTRAMUSCULAR | Status: DC | PRN
  Administered 2014-09-29: 4 mg via INTRAVENOUS
  Filled 2014-09-29: qty 1

## 2014-09-29 MED ORDER — NAPROXEN 500 MG PO TABS: 500.0000 mg | ORAL_TABLET | Freq: Two times a day (BID) | ORAL | Status: DC

## 2014-09-29 MED ORDER — SODIUM CHLORIDE 0.9 % IV BOLUS (SEPSIS)
500.0000 mL | Freq: Once | INTRAVENOUS | Status: AC
  Administered 2014-09-29: 500 mL via INTRAVENOUS

## 2014-09-29 MED ORDER — NITROGLYCERIN 0.4 MG SL SUBL
0.4000 mg | SUBLINGUAL_TABLET | SUBLINGUAL | Status: DC | PRN
  Administered 2014-09-29 (×3): 0.4 mg via SUBLINGUAL
  Filled 2014-09-29: qty 1

## 2014-09-29 MED ORDER — SODIUM CHLORIDE 0.9 % IV SOLN
Freq: Once | INTRAVENOUS | Status: AC
  Administered 2014-09-29: 16:00:00 via INTRAVENOUS

## 2014-09-29 MED ORDER — ONDANSETRON HCL 4 MG/2ML IJ SOLN
4.0000 mg | Freq: Once | INTRAMUSCULAR | Status: AC
  Administered 2014-09-29: 4 mg via INTRAVENOUS
  Filled 2014-09-29: qty 2

## 2014-09-29 NOTE — Discharge Instructions (Signed)

## 2014-09-29 NOTE — ED Notes (Signed)
Patient c/o left chest pain that radiates into left shoulder and arm x3 days. Patient reports shortness of breath and nausea. Denies any cardiac hx. Patient reports taking 325mg  aspirin this morning.

## 2014-09-29 NOTE — ED Notes (Addendum)
Pt states she took "325 mg pill of aspirin this am around 0800 because of my chest pain."

## 2014-09-29 NOTE — ED Notes (Signed)
MD at bedside. 

## 2014-09-29 NOTE — ED Provider Notes (Signed)
EKG Interpretation  Date/Time:  Tuesday September 29 2014 13:10:21 EST Ventricular Rate:  62 PR Interval:  132 QRS Duration: 72 QT Interval:  382 QTC Calculation: 387 R Axis:   79 Text Interpretation:  Normal sinus rhythm Possible Anterior infarct , age undetermined Abnormal ECG No significant change was found Confirmed by Patria ManeAMPOS  MD, Caryn BeeKEVIN ([id number]) on 09/29/2014 4:37:14 PM        Lyanne CoKevin M Colbe Viviano, MD 09/29/14 [phone]

## 2014-09-29 NOTE — ED Provider Notes (Addendum)
CSN: [id number]     Arrival date & time 09/29/14  1255 History  This chart was scribed for Rolland Porter, MD by Milly Jakob, ED Scribe. The patient was seen in room APA18/[facility]18. Patient's care was started at 1:29 PM.    Chief Complaint  Patient presents with  . Chest Pain   The history is provided by the patient. No language interpreter was used.   HPI Comments: AHLANA Stephens is a 48 y.o. female who presents to the Emergency Department complaining of intermittently sharp and occasionally dull pain across her upper chest and concentrated in her left chest, which began three days ago. She rates her pain as a 9/10. The pain radiates into the back of her left neck and arm, and has been constant all day today, since 5 AM. It has not gone away.. She reports associated SOB, abdominal pain, and nausea. She reports that the pain began when she was at rest, and it is not exacerbated by exercion. She states that she has had similar pain months ago, but that this is not an unusual  occurrence for her. She denies palpitations, or confusion. She reports taking an aspirin at home earlier today. She does smoke, she does not have DM, she does not have high cholesterol, but she reports that her brother has had a stent placed in his heart.  Heart score is 3 (1+ for mod suspicion, age, and 2 risks)  Past Medical History  Diagnosis Date  . Bronchitis   . Peptic ulcer   . Chronic back pain   . Chronic chest wall pain   . Chronic cough   . Encounter for blood transfusion   . Anemia   . Vertigo    Past Surgical History  Procedure Laterality Date  . Abdominal hysterectomy    . Cholecystectomy    . Tubal ligation    . Ulcer surgery     History reviewed. No pertinent family history. History  Substance Use Topics  . Smoking status: Current Every Day Smoker -- 1.00 packs/day for 30 years    Types: Cigarettes  . Smokeless tobacco: Never Used  . Alcohol Use: No   OB History    Gravida Para Term  Preterm AB TAB SAB Ectopic Multiple Living   2 1  1 1  1   1      Review of Systems  Constitutional: Negative for fever, chills, diaphoresis, appetite change and fatigue.  HENT: Negative for mouth sores, sore throat and trouble swallowing.   Eyes: Negative for visual disturbance.  Respiratory: Positive for shortness of breath. Negative for cough, chest tightness and wheezing.   Cardiovascular: Positive for chest pain.  Gastrointestinal: Positive for nausea and abdominal pain. Negative for vomiting, diarrhea and abdominal distention.  Endocrine: Negative for polydipsia, polyphagia and polyuria.  Genitourinary: Negative for dysuria, frequency and hematuria.  Musculoskeletal: Negative for gait problem.  Skin: Negative for color change, pallor and rash.  Neurological: Negative for dizziness, syncope, light-headedness and headaches.  Hematological: Does not bruise/bleed easily.  Psychiatric/Behavioral: Negative for behavioral problems and confusion.    Allergies  Ultracet and Ultram  Home Medications   Prior to Admission medications   Medication Sig Start Date End Date Taking? Authorizing Provider  albuterol (PROVENTIL HFA;VENTOLIN HFA) 108 (90 BASE) MCG/ACT inhaler Inhale 2 puffs into the lungs every 6 (six) hours as needed for wheezing or shortness of breath.   Yes Historical Provider, MD  acetaminophen (TYLENOL) 500 MG tablet Take 1,000 mg by mouth  every 6 (six) hours as needed for mild pain.    Historical Provider, MD  meclizine (ANTIVERT) 25 MG tablet Take 1 tablet (25 mg total) by mouth 3 (three) times daily as needed for dizziness. Patient not taking: Reported on 09/29/2014 06/21/14   Samuel JesterKathleen McManus, DO  naproxen (NAPROSYN) 500 MG tablet Take 1 tablet (500 mg total) by mouth 2 (two) times daily. 09/29/14   Rolland PorterMark Matalyn Nawaz, MD   Triage Vitals: BP 104/67 mmHg  Pulse 61  Temp(Src) 98.2 F (36.8 C) (Oral)  Resp 10  Ht 5\' 3"  (1.6 m)  Wt 105 lb (47.628 kg)  BMI 18.60 kg/m2  SpO2  98% Physical Exam  Constitutional: She is oriented to person, place, and time. She appears well-developed and well-nourished. No distress.  HENT:  Head: Normocephalic.  Eyes: Conjunctivae are normal. Pupils are equal, round, and reactive to light. No scleral icterus.  Neck: Normal range of motion. Neck supple. No thyromegaly present.  Cardiovascular: Normal rate and regular rhythm.  Exam reveals no gallop and no friction rub.   No murmur heard. Pulmonary/Chest: Effort normal and breath sounds normal. No respiratory distress. She has no wheezes. She has no rales.  Abdominal: Soft. Bowel sounds are normal. She exhibits no distension. There is no tenderness. There is no rebound.  Musculoskeletal: Normal range of motion.  Neurological: She is alert and oriented to person, place, and time.  Skin: Skin is warm and dry. No rash noted.  Psychiatric: She has a normal mood and affect. Her behavior is normal.  Nursing note and vitals reviewed.   ED Course  Procedures (including critical care time) DIAGNOSTIC STUDIES: Oxygen Saturation is 98% on room air, normal by my interpretation.    COORDINATION OF CARE: 1:34 PM-Discussed treatment plan which includes lab work, CXR, cardiac monitoring, EKG with pt at bedside and pt agreed to plan.   Labs Review Labs Reviewed  CBC - Abnormal; Notable for the following:    Hemoglobin 11.6 (*)    RDW 18.0 (*)    All other components within normal limits  BASIC METABOLIC PANEL  PRO B NATRIURETIC PEPTIDE  TROPONIN I  TROPONIN I    Imaging Review Dg Chest Port 1 View  09/29/2014   CLINICAL DATA:  48 year old female left-sided chest pain, shortness breath. Initial encounter.  EXAM: PORTABLE CHEST - 1 VIEW  COMPARISON:  08/04/2014  FINDINGS: The heart size and mediastinal contours are within normal limits.  There is no focal airspace consolidation pleural effusion or pneumothorax. There is no overt pulmonary edema  There is acute osseous abnormality.   Metallic clips project over the lower central chest.  IMPRESSION: No focal airspace consolidation.   Electronically Signed   By: Fannie KneeKenneth  Crosby   On: 09/29/2014 14:17     EKG Interpretation   Date/Time:  Tuesday September 29 2014 13:10:21 EST Ventricular Rate:  62 PR Interval:  132 QRS Duration: 72 QT Interval:  382 QTC Calculation: 387 R Axis:   79 Text Interpretation:  Normal sinus rhythm Possible Anterior infarct , age  undetermined Abnormal ECG No significant change was found Confirmed by  CAMPOS  MD, Caryn BeeKEVIN ([id number]) on 09/29/2014 4:37:14 PM      MDM   Final diagnoses:  Chest pain, unspecified chest pain type    Patient had no nitroglycerin. He getting IV morphine. Getting a second troponin. EKG shows no changes and initial troponin normal. This is after 10 hours of pain today and pain for the last 2 days. She is  not hypoxemic, febrile, or tachycardic. Does report a history of chronic pain states this is somewhat similar. If second troponin is normal think she would be appropriate for continued outpatient follow-up.  Central normal. Pain-free after one dose of medication. Plan discharge. Return with recurrence.   I personally performed the services described in this documentation, which was scribed in my presence. The recorded information has been reviewed and is accurate.    Rolland PorterMark Airrion Otting, MD 09/29/14 1704  Rolland PorterMark Garrick Midgley, MD 09/29/14 [phone]

## 2015-02-01 ENCOUNTER — Emergency Department (HOSPITAL_COMMUNITY): Payer: Self-pay

## 2015-02-01 ENCOUNTER — Encounter (HOSPITAL_COMMUNITY): Payer: Self-pay

## 2015-02-01 ENCOUNTER — Emergency Department (HOSPITAL_COMMUNITY)
Admission: EM | Admit: 2015-02-01 | Discharge: 2015-02-01 | Disposition: A | Discharge status: Home / Self Care | Payer: Self-pay | Attending: Emergency Medicine | Admitting: Emergency Medicine

## 2015-02-01 DIAGNOSIS — Z720 Tobacco use: Secondary | ICD-10-CM | POA: Insufficient documentation

## 2015-02-01 DIAGNOSIS — Z862 Personal history of diseases of the blood and blood-forming organs and certain disorders involving the immune mechanism: Secondary | ICD-10-CM | POA: Insufficient documentation

## 2015-02-01 DIAGNOSIS — N644 Mastodynia: Secondary | ICD-10-CM | POA: Insufficient documentation

## 2015-02-01 DIAGNOSIS — G8929 Other chronic pain: Secondary | ICD-10-CM | POA: Insufficient documentation

## 2015-02-01 DIAGNOSIS — R0789 Other chest pain: Secondary | ICD-10-CM | POA: Insufficient documentation

## 2015-02-01 DIAGNOSIS — Z8709 Personal history of other diseases of the respiratory system: Secondary | ICD-10-CM | POA: Insufficient documentation

## 2015-02-01 DIAGNOSIS — Z8719 Personal history of other diseases of the digestive system: Secondary | ICD-10-CM | POA: Insufficient documentation

## 2015-02-01 LAB — CBC WITH DIFFERENTIAL/PLATELET
Basophils Absolute: 0 10*3/uL (ref 0.0–0.1)
Basophils Relative: 1 % (ref 0–1)
EOS PCT: 0 % (ref 0–5)
Eosinophils Absolute: 0 10*3/uL (ref 0.0–0.7)
HCT: 36.1 % (ref 36.0–46.0)
HEMOGLOBIN: 11.2 g/dL — AB (ref 12.0–15.0)
Lymphocytes Relative: 30 % (ref 12–46)
Lymphs Abs: 1.6 10*3/uL (ref 0.7–4.0)
MCH: 27.9 pg (ref 26.0–34.0)
MCHC: 31 g/dL (ref 30.0–36.0)
MCV: 89.8 fL (ref 78.0–100.0)
MONO ABS: 0.3 10*3/uL (ref 0.1–1.0)
MONOS PCT: 6 % (ref 3–12)
NEUTROS PCT: 62 % (ref 43–77)
Neutro Abs: 3.2 10*3/uL (ref 1.7–7.7)
Platelets: 259 10*3/uL (ref 150–400)
RBC: 4.02 MIL/uL (ref 3.87–5.11)
RDW: 17.3 % — ABNORMAL HIGH (ref 11.5–15.5)
WBC: 5.2 10*3/uL (ref 4.0–10.5)

## 2015-02-01 LAB — TROPONIN I

## 2015-02-01 LAB — COMPREHENSIVE METABOLIC PANEL
ALT: 22 U/L (ref 0–35)
AST: 34 U/L (ref 0–37)
Albumin: 4.1 g/dL (ref 3.5–5.2)
Alkaline Phosphatase: 108 U/L (ref 39–117)
Anion gap: 6 (ref 5–15)
BUN: 9 mg/dL (ref 6–23)
CALCIUM: 9.5 mg/dL (ref 8.4–10.5)
CHLORIDE: 112 mmol/L (ref 96–112)
CO2: 24 mmol/L (ref 19–32)
CREATININE: 0.64 mg/dL (ref 0.50–1.10)
GFR calc Af Amer: >90 mL/min (ref >90)
GLUCOSE: 99 mg/dL (ref 70–99)
Potassium: 3.9 mmol/L (ref 3.5–5.1)
Sodium: 142 mmol/L (ref 135–145)
Total Bilirubin: 0.3 mg/dL (ref 0.3–1.2)
Total Protein: 7.5 g/dL (ref 6.0–8.3)

## 2015-02-01 MED ORDER — HYDROCODONE-ACETAMINOPHEN 5-325 MG PO TABS
1.0000 | ORAL_TABLET | Freq: Once | ORAL | Status: AC
  Administered 2015-02-01: 1 via ORAL
  Filled 2015-02-01: qty 1

## 2015-02-01 MED ORDER — HYDROCODONE-ACETAMINOPHEN 5-325 MG PO TABS: 1.0000 | ORAL_TABLET | Freq: Four times a day (QID) | ORAL | Status: DC | PRN

## 2015-02-01 MED ORDER — KETOROLAC TROMETHAMINE 30 MG/ML IJ SOLN
30.0000 mg | Freq: Once | INTRAMUSCULAR | Status: AC
  Administered 2015-02-01: 30 mg via INTRAVENOUS
  Filled 2015-02-01: qty 1

## 2015-02-01 MED ORDER — IBUPROFEN 600 MG PO TABS: 600.0000 mg | ORAL_TABLET | Freq: Four times a day (QID) | ORAL | Status: DC | PRN

## 2015-02-01 NOTE — ED Notes (Signed)
Pt c/o pain in both sides of chest and in r breast for past 2 days.  Reports is worse with movement and r breast tender.

## 2015-02-01 NOTE — ED Notes (Signed)
MD at bedside. 

## 2015-02-01 NOTE — ED Provider Notes (Signed)
CSN: [id number]     Arrival date & time 02/01/15  1203 History   First MD Initiated Contact with Patient 02/01/15 1236     Chief Complaint  Patient presents with  . Breast Pain  . Chest Pain     (Consider location/radiation/quality/duration/timing/severity/associated sxs/prior Treatment) HPI  This a 49 year old female with a history of bronchitis, smoking who presents with chest pain. Patient reports a one to 2 week history of right-sided chest pain that radiates into her right breast. She states it is worse with movement. She denies any nipple discharge. She denies any shortness of breath or diaphoresis. She has taken ibuprofen without relief.  She rates current pain at 10 out of 10. No known history of heart disease.  Past Medical History  Diagnosis Date  . Bronchitis   . Peptic ulcer   . Chronic back pain   . Chronic chest wall pain   . Chronic cough   . Encounter for blood transfusion   . Anemia   . Vertigo    Past Surgical History  Procedure Laterality Date  . Abdominal hysterectomy    . Cholecystectomy    . Tubal ligation    . Ulcer surgery     No family history on file. History  Substance Use Topics  . Smoking status: Current Every Day Smoker -- 1.00 packs/day for 30 years    Types: Cigarettes  . Smokeless tobacco: Never Used  . Alcohol Use: No   OB History    Gravida Para Term Preterm AB TAB SAB Ectopic Multiple Living   Review of Systems  Constitutional: Negative for fever.  Respiratory: Negative for cough, chest tightness and shortness of breath.   Cardiovascular: Positive for chest pain.  Gastrointestinal: Negative for nausea, vomiting and abdominal pain.  Genitourinary: Negative for dysuria.  Musculoskeletal: Negative for back pain.  Skin: Negative for color change and rash.  Neurological: Negative for headaches.  All other systems reviewed and are negative.     Allergies  Ultracet and Ultram  Home Medications   Prior to  Admission medications   Medication Sig Start Date End Date Taking? Authorizing Provider  aspirin 325 MG tablet Take 325 mg by mouth 2 (two) times daily as needed (pain).   Yes Historical Provider, MD  HYDROcodone-acetaminophen (NORCO/VICODIN) 5-325 MG per tablet Take 1 tablet by mouth every 6 (six) hours as needed for moderate pain. 02/01/15   Shon Baton, MD  ibuprofen (ADVIL,MOTRIN) 600 MG tablet Take 1 tablet (600 mg total) by mouth every 6 (six) hours as needed. 02/01/15   Shon Baton, MD  meclizine (ANTIVERT) 25 MG tablet Take 1 tablet (25 mg total) by mouth 3 (three) times daily as needed for dizziness. Patient not taking: Reported on 02/01/2015 06/21/14   Samuel Jester, DO  naproxen (NAPROSYN) 500 MG tablet Take 1 tablet (500 mg total) by mouth 2 (two) times daily. Patient not taking: Reported on 02/01/2015 09/29/14   Rolland Porter, MD   BP 102/73 mmHg  Pulse 61  Temp(Src) 98.6 F (37 C) (Oral)  Resp 16  Ht  (1.6 m)  Wt 105 lb (47.628 kg)  BMI 18.60 kg/m2  SpO2 96% Physical Exam  Constitutional: She is oriented to person, place, and time. She appears well-developed and well-nourished. No distress.  HENT:  Head: Normocephalic and atraumatic.  Cardiovascular: Normal rate, regular rhythm and normal heart sounds.   No murmur heard.  Pulmonary/Chest: Effort normal. No respiratory distress. She has no wheezes. She exhibits tenderness.  Tenderness to palpation over the right chest wall, no palpable axillary lymph nodes, no breast lumps noted  Abdominal: Soft. Bowel sounds are normal.  Musculoskeletal: She exhibits no edema.  Neurological: She is alert and oriented to person, place, and time.  Skin: Skin is warm and dry.  Psychiatric: She has a normal mood and affect.  Nursing note and vitals reviewed.   ED Course  Procedures (including critical care time) Labs Review Labs Reviewed  CBC WITH DIFFERENTIAL/PLATELET - Abnormal; Notable for the following:    Hemoglobin  11.2 (*)    RDW 17.3 (*)    All other components within normal limits  COMPREHENSIVE METABOLIC PANEL  TROPONIN I    Imaging Review Dg Chest 2 View  02/01/2015   CLINICAL DATA:  Right-sided chest pain for 1 month.  EXAM: CHEST  2 VIEW  COMPARISON:  September 29, 2014.  FINDINGS: The heart size and mediastinal contours are within normal limits. Both lungs are clear. No pneumothorax or pleural effusion is noted. The visualized skeletal structures are unremarkable.  IMPRESSION: No active cardiopulmonary disease.   Electronically Signed   By: Lupita RaiderJames  Green Jr, M.D.   On: 02/01/2015 13:08     EKG Interpretation   Date/Time:  Monday February 01 2015 12:11:11 EDT Ventricular Rate:  81 PR Interval:  138 QRS Duration: 68 QT Interval:  346 QTC Calculation: 401 R Axis:   72 Text Interpretation:  Normal sinus rhythm with sinus arrhythmia Possible  Left atrial enlargement Borderline ECG No significant change since last  tracing Confirmed by Lylianna Fraiser  MD, Toni AmendOURTNEY ([id number]) on 02/01/2015 12:16:50 PM      MDM   Final diagnoses:  Chest wall pain  Breast pain    Patient presents with chest wall pain. Reproducible on exam. Nontoxic and vital signs reassuring.  Low suspicion for ACS given history and heart score of 2.  EKG and chest x-ray reassuring. Patient given Toradol and Norco for pain.  Patient does report some improvement of her pain with pain medication. Discussed with patient outpatient follow-up. Social worker at the bedside to give patient resources regarding primary care. Patient was also given referral for cardiology and the breast clinic.  After history, exam, and medical workup I feel the patient has been appropriately medically screened and is safe for discharge home. Pertinent diagnoses were discussed with the patient. Patient was given return precautions.     Shon Batonourtney F Machai Desmith, MD 02/01/15 [phone]

## 2015-02-01 NOTE — Discharge Instructions (Signed)
You were seen today for chest and breast pain. Your exam is reassuring. Your workup is negative. You should follow-up with cardiology and the breast clinic for further evaluation. It appears that your pain is originating from her chest wall. He will be given pain medication. If you have any new or worsening symptoms you should be reevaluated immediately.   Chest Pain (Nonspecific) It is often hard to give a specific diagnosis for the cause of chest pain. There is always a chance that your pain could be related to something serious, such as a heart attack or a blood clot in the lungs. You need to follow up with your health care provider for further evaluation. CAUSES   Heartburn.  Pneumonia or bronchitis.  Anxiety or stress.  Inflammation around your heart (pericarditis) or lung (pleuritis or pleurisy).  A blood clot in the lung.  A collapsed lung (pneumothorax). It can develop suddenly on its own (spontaneous pneumothorax) or from trauma to the chest.  Shingles infection (herpes zoster virus). The chest wall is composed of bones, muscles, and cartilage. Any of these can be the source of the pain.  The bones can be bruised by injury.  The muscles or cartilage can be strained by coughing or overwork.  The cartilage can be affected by inflammation and become sore (costochondritis). DIAGNOSIS  Lab tests or other studies may be needed to find the cause of your pain. Your health care provider may have you take a test called an ambulatory electrocardiogram (ECG). An ECG records your heartbeat patterns over a 24-hour period. You may also have other tests, such as:  Transthoracic echocardiogram (TTE). During echocardiography, sound waves are used to evaluate how blood flows through your heart.  Transesophageal echocardiogram (TEE).  Cardiac monitoring. This allows your health care provider to monitor your heart rate and rhythm in real time.  Holter monitor. This is a portable device that  records your heartbeat and can help diagnose heart arrhythmias. It allows your health care provider to track your heart activity for several days, if needed.  Stress tests by exercise or by giving medicine that makes the heart beat faster. TREATMENT   Treatment depends on what may be causing your chest pain. Treatment may include:  Acid blockers for heartburn.  Anti-inflammatory medicine.  Pain medicine for inflammatory conditions.  Antibiotics if an infection is present.  You may be advised to change lifestyle habits. This includes stopping smoking and avoiding alcohol, caffeine, and chocolate.  You may be advised to keep your head raised (elevated) when sleeping. This reduces the chance of acid going backward from your stomach into your esophagus. Most of the time, nonspecific chest pain will improve within 2-3 days with rest and mild pain medicine.  HOME CARE INSTRUCTIONS   If antibiotics were prescribed, take them as directed. Finish them even if you start to feel better.  For the next few days, avoid physical activities that bring on chest pain. Continue physical activities as directed.  Do not use any tobacco products, including cigarettes, chewing tobacco, or electronic cigarettes.  Avoid drinking alcohol.  Only take medicine as directed by your health care provider.  Follow your health care provider's suggestions for further testing if your chest pain does not go away.  Keep any follow-up appointments you made. If you do not go to an appointment, you could develop lasting (chronic) problems with pain. If there is any problem keeping an appointment, call to reschedule. SEEK MEDICAL CARE IF:   Your chest pain  does not go away, even after treatment.  You have a rash with blisters on your chest.  You have a fever. SEEK IMMEDIATE MEDICAL CARE IF:   You have increased chest pain or pain that spreads to your arm, neck, jaw, back, or abdomen.  You have shortness of  breath.  You have an increasing cough, or you cough up blood.  You have severe back or abdominal pain.  You feel nauseous or vomit.  You have severe weakness.  You faint.  You have chills. This is an emergency. Do not wait to see if the pain will go away. Get medical help at once. Call your local emergency services (911 in [place] Do not drive yourself to the hospital. MAKE SURE YOU:   Understand these instructions.  Will watch your condition.  Will get help right away if you are not doing well or get worse. Document Released: 08/16/2005 Document Revised: 11/11/2013 Document Reviewed: 06/11/2008 [facility] Patient Information 2015 [place], [place]. This information is not intended to replace advice given to you by your health care provider. Make sure you discuss any questions you have with your health care provider.  Breast Tenderness Breast tenderness is a common problem for women of all ages. Breast tenderness may cause mild discomfort to severe pain. It has a variety of causes. Your health care provider will find out the likely cause of your breast tenderness by examining your breasts, asking you about symptoms, and ordering some tests. Breast tenderness usually does not mean you have breast cancer. HOME CARE INSTRUCTIONS  Breast tenderness often can be handled at home. You can try:  Getting fitted for a new bra that provides more support, especially during exercise.  Wearing a more supportive bra or sports bra while sleeping when your breasts are very tender.  If you have a breast injury, apply ice to the area:  Put ice in a plastic bag.  Place a towel between your skin and the bag.  Leave the ice on for 20 minutes, 2-3 times a day.  If your breasts are too full of milk as a result of breastfeeding, try:  Expressing milk either by hand or with a breast pump.  Applying a warm compress to the breasts for relief.  Taking over-the-counter pain relievers, if approved by your  health care provider.  Taking other medicines that your health care provider prescribes. These may include antibiotic medicines or birth control pills. Over the long term, your breast tenderness might be eased if you:  Cut down on caffeine.  Reduce the amount of fat in your diet. Keep a log of the days and times when your breasts are most tender. This will help you and your health care provider find the cause of the tenderness and how to relieve it. Also, learn how to do breast exams at home. This will help you notice if you have an unusual growth or lump that could cause tenderness. SEEK MEDICAL CARE IF:   Any part of your breast is hard, red, and hot to the touch. This could be a sign of infection.  Fluid is coming out of your nipples (and you are not breastfeeding). Especially watch for blood or pus.  You have a fever as well as breast tenderness.  You have a new or painful lump in your breast that remains after your menstrual period ends.  You have tried to take care of the pain at home, but it has not gone away.  Your breast pain is getting  worse, or the pain is making it hard to do the things you usually do during your day. Document Released: 10/19/2008 Document Revised: 07/09/2013 Document Reviewed: 06/05/2013 [facility] Patient Information 2015 [place], [place]. This information is not intended to replace advice given to you by your health care provider. Make sure you discuss any questions you have with your health care provider.

## 2015-02-21 ENCOUNTER — Emergency Department (HOSPITAL_COMMUNITY): Payer: Self-pay

## 2015-02-21 ENCOUNTER — Encounter (HOSPITAL_COMMUNITY): Payer: Self-pay | Admitting: Cardiology

## 2015-02-21 ENCOUNTER — Emergency Department (HOSPITAL_COMMUNITY)
Admission: EM | Admit: 2015-02-21 | Discharge: 2015-02-21 | Disposition: A | Discharge status: Home / Self Care | Payer: Self-pay | Attending: Emergency Medicine | Admitting: Emergency Medicine

## 2015-02-21 DIAGNOSIS — J40 Bronchitis, not specified as acute or chronic: Secondary | ICD-10-CM

## 2015-02-21 DIAGNOSIS — Z720 Tobacco use: Secondary | ICD-10-CM | POA: Insufficient documentation

## 2015-02-21 DIAGNOSIS — J209 Acute bronchitis, unspecified: Secondary | ICD-10-CM | POA: Insufficient documentation

## 2015-02-21 DIAGNOSIS — G8929 Other chronic pain: Secondary | ICD-10-CM | POA: Insufficient documentation

## 2015-02-21 DIAGNOSIS — Z792 Long term (current) use of antibiotics: Secondary | ICD-10-CM | POA: Insufficient documentation

## 2015-02-21 DIAGNOSIS — R079 Chest pain, unspecified: Secondary | ICD-10-CM | POA: Insufficient documentation

## 2015-02-21 DIAGNOSIS — Z79899 Other long term (current) drug therapy: Secondary | ICD-10-CM | POA: Insufficient documentation

## 2015-02-21 DIAGNOSIS — Z8719 Personal history of other diseases of the digestive system: Secondary | ICD-10-CM | POA: Insufficient documentation

## 2015-02-21 DIAGNOSIS — Z862 Personal history of diseases of the blood and blood-forming organs and certain disorders involving the immune mechanism: Secondary | ICD-10-CM | POA: Insufficient documentation

## 2015-02-21 DIAGNOSIS — Z7982 Long term (current) use of aspirin: Secondary | ICD-10-CM | POA: Insufficient documentation

## 2015-02-21 LAB — COMPREHENSIVE METABOLIC PANEL
ALK PHOS: 118 U/L — AB (ref 39–117)
ALT: 18 U/L (ref 0–35)
AST: 25 U/L (ref 0–37)
Albumin: 3.5 g/dL (ref 3.5–5.2)
Anion gap: 8 (ref 5–15)
BILIRUBIN TOTAL: 0.2 mg/dL — AB (ref 0.3–1.2)
BUN: 13 mg/dL (ref 6–23)
CHLORIDE: 111 mmol/L (ref 96–112)
CO2: 22 mmol/L (ref 19–32)
CREATININE: 0.58 mg/dL (ref 0.50–1.10)
Calcium: 8.6 mg/dL (ref 8.4–10.5)
Glucose, Bld: 98 mg/dL (ref 70–99)
POTASSIUM: 3.9 mmol/L (ref 3.5–5.1)
Sodium: 141 mmol/L (ref 135–145)
Total Protein: 6.5 g/dL (ref 6.0–8.3)

## 2015-02-21 LAB — CBC WITH DIFFERENTIAL/PLATELET
Basophils Absolute: 0 10*3/uL (ref 0.0–0.1)
Basophils Relative: 0 % (ref 0–1)
EOS ABS: 0 10*3/uL (ref 0.0–0.7)
EOS PCT: 0 % (ref 0–5)
HCT: 31.3 % — ABNORMAL LOW (ref 36.0–46.0)
Hemoglobin: 9.8 g/dL — ABNORMAL LOW (ref 12.0–15.0)
LYMPHS PCT: 17 % (ref 12–46)
Lymphs Abs: 1.3 10*3/uL (ref 0.7–4.0)
MCH: 28 pg (ref 26.0–34.0)
MCHC: 31.3 g/dL (ref 30.0–36.0)
MCV: 89.4 fL (ref 78.0–100.0)
MONO ABS: 0.3 10*3/uL (ref 0.1–1.0)
Monocytes Relative: 4 % (ref 3–12)
NEUTROS ABS: 5.7 10*3/uL (ref 1.7–7.7)
NEUTROS PCT: 79 % — AB (ref 43–77)
PLATELETS: 266 10*3/uL (ref 150–400)
RBC: 3.5 MIL/uL — AB (ref 3.87–5.11)
RDW: 16.5 % — ABNORMAL HIGH (ref 11.5–15.5)
WBC: 7.3 10*3/uL (ref 4.0–10.5)

## 2015-02-21 LAB — TROPONIN I

## 2015-02-21 MED ORDER — HYDROCODONE-ACETAMINOPHEN 5-325 MG PO TABS
1.0000 | ORAL_TABLET | Freq: Once | ORAL | Status: AC
  Administered 2015-02-21: 1 via ORAL
  Filled 2015-02-21: qty 1

## 2015-02-21 MED ORDER — AZITHROMYCIN 250 MG PO TABS: ORAL_TABLET | ORAL | Status: DC

## 2015-02-21 MED ORDER — AZITHROMYCIN 250 MG PO TABS
500.0000 mg | ORAL_TABLET | Freq: Once | ORAL | Status: AC
  Administered 2015-02-21: 500 mg via ORAL
  Filled 2015-02-21: qty 2

## 2015-02-21 MED ORDER — HYDROCODONE-ACETAMINOPHEN 5-325 MG PO TABS: 1.0000 | ORAL_TABLET | Freq: Four times a day (QID) | ORAL | Status: DC | PRN

## 2015-02-21 NOTE — ED Notes (Signed)
Chest pain,  Cough and achy all over .

## 2015-02-21 NOTE — ED Provider Notes (Signed)
CSN: [id number]     Arrival date & time 02/21/15  1255 History   First MD Initiated Contact with Patient 02/21/15 1416     Chief Complaint  Patient presents with  . Chest Pain  . Cough     (Consider location/radiation/quality/duration/timing/severity/associated sxs/prior Treatment) Patient is a 49 y.o. female presenting with chest pain and cough. The history is provided by the patient (pt complains of a cough and chest pain).  Chest Pain Pain location:  L chest Pain quality: aching   Pain radiates to:  Does not radiate Pain radiates to the back: no   Pain severity:  Mild Onset quality:  Gradual Timing:  Intermittent Progression:  Waxing and waning Chronicity:  New Associated symptoms: cough   Associated symptoms: no abdominal pain, no back pain, no fatigue and no headache   Cough Associated symptoms: chest pain   Associated symptoms: no eye discharge, no headaches and no rash     Past Medical History  Diagnosis Date  . Bronchitis   . Peptic ulcer   . Chronic back pain   . Chronic chest wall pain   . Chronic cough   . Encounter for blood transfusion   . Anemia   . Vertigo    Past Surgical History  Procedure Laterality Date  . Abdominal hysterectomy    . Cholecystectomy    . Tubal ligation    . Ulcer surgery     History reviewed. No pertinent family history. History  Substance Use Topics  . Smoking status: Current Every Day Smoker -- 1.00 packs/day for 30 years    Types: Cigarettes  . Smokeless tobacco: Never Used  . Alcohol Use: No   OB History    Gravida Para Term Preterm AB TAB SAB Ectopic Multiple Living   Review of Systems  Constitutional: Negative for appetite change and fatigue.  HENT: Negative for congestion, ear discharge and sinus pressure.   Eyes: Negative for discharge.  Respiratory: Positive for cough.   Cardiovascular: Positive for chest pain.  Gastrointestinal: Negative for abdominal pain and diarrhea.  Genitourinary:  Negative for frequency and hematuria.  Musculoskeletal: Negative for back pain.  Skin: Negative for rash.  Neurological: Negative for seizures and headaches.  Psychiatric/Behavioral: Negative for hallucinations.      Allergies  Ultracet and Ultram  Home Medications   Prior to Admission medications   Medication Sig Start Date End Date Taking? Authorizing Provider  albuterol (PROVENTIL HFA;VENTOLIN HFA) 108 (90 BASE) MCG/ACT inhaler Inhale 2 puffs into the lungs every 6 (six) hours as needed for shortness of breath.   Yes Historical Provider, MD  aspirin 325 MG tablet Take 325 mg by mouth 2 (two) times daily as needed (pain).   Yes Historical Provider, MD  azithromycin (ZITHROMAX) 250 MG tablet Take one tablet a day,  Start on monday 02/21/15   Bethann Berkshire, MD  HYDROcodone-acetaminophen (NORCO/VICODIN) 5-325 MG per tablet Take 1 tablet by mouth every 6 (six) hours as needed for moderate pain. 02/21/15   Bethann Berkshire, MD  ibuprofen (ADVIL,MOTRIN) 600 MG tablet Take 1 tablet (600 mg total) by mouth every 6 (six) hours as needed. Patient not taking: Reported on 02/21/2015 02/01/15   Shon Baton, MD  meclizine (ANTIVERT) 25 MG tablet Take 1 tablet (25 mg total) by mouth 3 (three) times daily as needed for dizziness. 06/21/14   Samuel Jester, DO  naproxen (NAPROSYN) 500 MG tablet Take 1  tablet (500 mg total) by mouth 2 (two) times daily. Patient not taking: Reported on 02/01/2015 09/29/14   Rolland PorterMark James, MD   BP 112/53 mmHg  Pulse 60  Temp(Src) 98 F (36.7 C) (Oral)  Resp 15  Ht 5\' 3"  (1.6 m)  Wt 96 lb 11.2 oz (43.863 kg)  BMI 17.13 kg/m2  SpO2 100% Physical Exam  Constitutional: She is oriented to person, place, and time. She appears well-developed.  HENT:  Head: Normocephalic.  Eyes: Conjunctivae and EOM are normal. No scleral icterus.  Neck: Neck supple. No thyromegaly present.  Cardiovascular: Normal rate and regular rhythm.  Exam reveals no gallop and no friction rub.   No  murmur heard. Pulmonary/Chest: No stridor. She has no wheezes. She has no rales. She exhibits tenderness.  Abdominal: She exhibits no distension. There is no tenderness. There is no rebound.  Musculoskeletal: Normal range of motion. She exhibits no edema.  Lymphadenopathy:    She has no cervical adenopathy.  Neurological: She is oriented to person, place, and time. She exhibits normal muscle tone. Coordination normal.  Skin: No rash noted. No erythema.  Psychiatric: She has a normal mood and affect. Her behavior is normal.    ED Course  Procedures (including critical care time) Labs Review Labs Reviewed  CBC WITH DIFFERENTIAL/PLATELET - Abnormal; Notable for the following:    RBC 3.50 (*)    Hemoglobin 9.8 (*)    HCT 31.3 (*)    RDW 16.5 (*)    Neutrophils Relative % 79 (*)    All other components within normal limits  COMPREHENSIVE METABOLIC PANEL - Abnormal; Notable for the following:    Alkaline Phosphatase 118 (*)    Total Bilirubin 0.2 (*)    All other components within normal limits  TROPONIN I    Imaging Review Dg Chest 2 View  02/21/2015   CLINICAL DATA:  Chest pain and cough  EXAM: CHEST  2 VIEW  COMPARISON:  02/01/2015  FINDINGS: The heart size and mediastinal contours are within normal limits. Both lungs are clear. The visualized skeletal structures are unremarkable. Clips project over the epigastrium.  IMPRESSION: No active cardiopulmonary disease.   Electronically Signed   By: Christiana PellantGretchen  Green M.D.   On: 02/21/2015 15:10     EKG Interpretation   Date/Time:  Sunday February 21 2015 12:59:54 EDT Ventricular Rate:  85 PR Interval:  134 QRS Duration: 66 QT Interval:  350 QTC Calculation: 416 R Axis:   77 Text Interpretation:  Normal sinus rhythm with sinus arrhythmia Possible  Left atrial enlargement Borderline ECG Confirmed by Aiya Keach  MD, Benjamim Harnish  [phone] on 02/21/2015 4:58:53 PM      MDM   Final diagnoses:  Bronchitis    Bronchitis and chest wall pain,  tx  with zpak and vicodin and follow up    Bethann BerkshireJoseph Jermya Dowding, MD 02/21/15 1712

## 2015-02-21 NOTE — Discharge Instructions (Signed)
Follow up if not improving

## 2015-03-02 ENCOUNTER — Emergency Department (HOSPITAL_COMMUNITY)
Admission: EM | Admit: 2015-03-02 | Discharge: 2015-03-02 | Disposition: A | Discharge status: Home / Self Care | Payer: Self-pay | Attending: Emergency Medicine | Admitting: Emergency Medicine

## 2015-03-02 ENCOUNTER — Emergency Department (HOSPITAL_COMMUNITY): Payer: Self-pay

## 2015-03-02 ENCOUNTER — Encounter (HOSPITAL_COMMUNITY): Payer: Self-pay | Admitting: Emergency Medicine

## 2015-03-02 DIAGNOSIS — Z8709 Personal history of other diseases of the respiratory system: Secondary | ICD-10-CM | POA: Insufficient documentation

## 2015-03-02 DIAGNOSIS — Z7982 Long term (current) use of aspirin: Secondary | ICD-10-CM | POA: Insufficient documentation

## 2015-03-02 DIAGNOSIS — W19XXXA Unspecified fall, initial encounter: Secondary | ICD-10-CM

## 2015-03-02 DIAGNOSIS — Z8711 Personal history of peptic ulcer disease: Secondary | ICD-10-CM | POA: Insufficient documentation

## 2015-03-02 DIAGNOSIS — Z862 Personal history of diseases of the blood and blood-forming organs and certain disorders involving the immune mechanism: Secondary | ICD-10-CM | POA: Insufficient documentation

## 2015-03-02 DIAGNOSIS — G8929 Other chronic pain: Secondary | ICD-10-CM | POA: Insufficient documentation

## 2015-03-02 DIAGNOSIS — Y92000 Kitchen of unspecified non-institutional (private) residence as  the place of occurrence of the external cause: Secondary | ICD-10-CM | POA: Insufficient documentation

## 2015-03-02 DIAGNOSIS — Y998 Other external cause status: Secondary | ICD-10-CM | POA: Insufficient documentation

## 2015-03-02 DIAGNOSIS — S300XXA Contusion of lower back and pelvis, initial encounter: Secondary | ICD-10-CM | POA: Insufficient documentation

## 2015-03-02 DIAGNOSIS — Y92009 Unspecified place in unspecified non-institutional (private) residence as the place of occurrence of the external cause: Secondary | ICD-10-CM

## 2015-03-02 DIAGNOSIS — Y9389 Activity, other specified: Secondary | ICD-10-CM | POA: Insufficient documentation

## 2015-03-02 DIAGNOSIS — Z720 Tobacco use: Secondary | ICD-10-CM | POA: Insufficient documentation

## 2015-03-02 DIAGNOSIS — Z79899 Other long term (current) drug therapy: Secondary | ICD-10-CM | POA: Insufficient documentation

## 2015-03-02 DIAGNOSIS — W010XXA Fall on same level from slipping, tripping and stumbling without subsequent striking against object, initial encounter: Secondary | ICD-10-CM | POA: Insufficient documentation

## 2015-03-02 MED ORDER — KETOROLAC TROMETHAMINE 60 MG/2ML IM SOLN
30.0000 mg | Freq: Once | INTRAMUSCULAR | Status: AC
  Administered 2015-03-02: 30 mg via INTRAMUSCULAR
  Filled 2015-03-02: qty 2

## 2015-03-02 MED ORDER — CYCLOBENZAPRINE HCL 10 MG PO TABS
10.0000 mg | ORAL_TABLET | Freq: Once | ORAL | Status: AC
  Administered 2015-03-02: 10 mg via ORAL
  Filled 2015-03-02: qty 1

## 2015-03-02 MED ORDER — DICLOFENAC SODIUM 50 MG PO TBEC: 50.0000 mg | DELAYED_RELEASE_TABLET | Freq: Two times a day (BID) | ORAL | Status: DC

## 2015-03-02 NOTE — ED Notes (Signed)
Pt called EMS for ride to ED for back pain. States she slipped in water last night and has been having pain ever since. Pt admits to drinking two beers today.

## 2015-03-02 NOTE — ED Provider Notes (Signed)
CSN: [id number]     Arrival date & time 03/02/15  1935 History  This chart was scribed for [facility], NP working with Doug Sou, MD by Evon Slack, ED Scribe. This patient was seen in room APFT21/APFT21 and the patient's care was started at 8:07 PM.      Chief Complaint  Patient presents with  . Back Pain   Patient is a 49 y.o. female presenting with back pain. The history is provided by the patient. No language interpreter was used.  Back Pain Location:  Lumbar spine Radiates to:  R posterior upper leg Pain severity:  Mild Duration:  1 day Timing:  Constant Progression:  Worsening Context: falling   Relieved by:  Nothing Worsened by:  Nothing tried Ineffective treatments:  NSAIDs Associated symptoms: leg pain   Associated symptoms: no bladder incontinence, no bowel incontinence and no dysuria    HPI Comments: BRAYLINN GULDEN is a 49 y.o. female with PMHx of chronic back pain who presents to the Emergency Department complaining of worsening back pain onset 1 night prior. Pt states that she fell 1 night ago after slipping on water in her kitchen. Pt states that she fell directly on her back. Pt states that she feels as if there is a "knot" in her back. Pt states that the pain has progressively worsened to the point where she can't walk. Pt states that the pain is radiating  Pt states that she has tried Asprin with no relief. Denies bowel/bladder incontinence, dysuria,urinary frequancy or other related symptoms. Pt admits to having a few 12 oz beers today. Pt denies illicit drug use.    Past Medical History  Diagnosis Date  . Bronchitis   . Peptic ulcer   . Chronic back pain   . Chronic chest wall pain   . Chronic cough   . Encounter for blood transfusion   . Anemia   . Vertigo    Past Surgical History  Procedure Laterality Date  . Abdominal hysterectomy    . Cholecystectomy    . Tubal ligation    . Ulcer surgery     No family history on file. History   Substance Use Topics  . Smoking status: Current Every Day Smoker -- 1.00 packs/day for 30 years    Types: Cigarettes  . Smokeless tobacco: Never Used  . Alcohol Use: Yes   OB History    Gravida Para Term Preterm AB TAB SAB Ectopic Multiple Living   Review of Systems  Gastrointestinal: Negative for bowel incontinence.  Genitourinary: Negative for bladder incontinence and dysuria.  Musculoskeletal: Positive for back pain.  All other systems reviewed and are negative.   Allergies  Ultracet and Ultram  Home Medications   Prior to Admission medications   Medication Sig Start Date End Date Taking? Authorizing Provider  albuterol (PROVENTIL HFA;VENTOLIN HFA) 108 (90 BASE) MCG/ACT inhaler Inhale 2 puffs into the lungs every 6 (six) hours as needed for shortness of breath.    Historical Provider, MD  aspirin 325 MG tablet Take 325 mg by mouth 2 (two) times daily as needed (pain).    Historical Provider, MD  azithromycin (ZITHROMAX) 250 MG tablet Take one tablet a day,  Start on monday 02/21/15   Bethann Berkshire, MD  diclofenac (VOLTAREN) 50 MG EC tablet Take 1 tablet (50 mg total) by mouth 2 (two) times daily. 03/02/15   Hope Orlene Och, NP  HYDROcodone-acetaminophen (NORCO/VICODIN) 5-325 MG per tablet Take 1 tablet by mouth every 6 (six) hours as needed for moderate pain. 02/21/15   Bethann Berkshire, MD  meclizine (ANTIVERT) 25 MG tablet Take 1 tablet (25 mg total) by mouth 3 (three) times daily as needed for dizziness. 06/21/14   Samuel Jester, DO   BP 106/68 mmHg  Pulse 70  Temp(Src) 98.7 F (37.1 C) (Oral)  Resp 20  Ht  (1.6 m)  Wt 96 lb (43.545 kg)  BMI 17.01 kg/m2  SpO2 96%   Physical Exam  Constitutional: She is oriented to person, place, and time. She appears well-developed and well-nourished. No distress.  HENT:  Head: Normocephalic and atraumatic.  Right Ear: Tympanic membrane and external ear normal.  Left Ear: Tympanic membrane and external ear  normal.  Nose: Nose normal.  Mouth/Throat: Uvula is midline, oropharynx is clear and moist and mucous membranes are normal.  TM's clear with light reflex present.   Eyes: Conjunctivae and EOM are normal. Pupils are equal, round, and reactive to light.  Neck: Normal range of motion. Neck supple. No tracheal deviation present.  Cardiovascular: Normal rate and regular rhythm.   Pulses:      Dorsalis pedis pulses are 2+ on the right side, and 2+ on the left side.  Pulmonary/Chest: Effort normal. No respiratory distress. She has no wheezes. She has no rales.  Abdominal: Soft. Bowel sounds are normal. There is no tenderness.  Musculoskeletal: Normal range of motion.       Lumbar back: She exhibits tenderness and pain. She exhibits no spasm and normal pulse.       Back:  Pain with palpation over sacrum.    Neurological: She is alert and oriented to person, place, and time. She has normal strength. No cranial nerve deficit or sensory deficit. Gait normal.  Reflex Scores:      Bicep reflexes are 2+ on the right side and 2+ on the left side.      Brachioradialis reflexes are 2+ on the right side and 2+ on the left side.      Patellar reflexes are 2+ on the right side and 2+ on the left side.      Achilles reflexes are 2+ on the right side and 2+ on the left side. Good touch sensation. SLR without pain. Normal gait without foot drop.   Skin: Skin is warm and dry.  Psychiatric: She has a normal mood and affect. Her behavior is normal.  Nursing note and vitals reviewed.   ED Course  Procedures (including critical care time) Flexeril 10 mg PO, Toradol 30 mg IM, x-rays  DIAGNOSTIC STUDIES: Oxygen Saturation is 96% on RA, adequate by my interpretation.    COORDINATION OF CARE: 8:46 PM-Discussed treatment plan with pt at bedside and pt agreed to plan.     Labs Review Labs Reviewed - No data to display  Imaging Review Dg Lumbar Spine Complete  03/02/2015   CLINICAL DATA:  Slipped, fall  last night.  Low back pain since fall.  EXAM: LUMBAR SPINE - COMPLETE 4+ VIEW  COMPARISON:  CT 01/02/2015  FINDINGS: There is no evidence of lumbar spine fracture. Alignment is normal. Intervertebral disc spaces are maintained.  IMPRESSION: Negative.   Electronically Signed   By: Charlett Nose M.D.   On: 03/02/2015 20:26    MDM  49 y.o. female with low back pain s/p fall at home last night. Stable for d/c without focal neuro deficits. Will treat with NSAIDS and  she is to follow up with her doctor. Discussed with the patient and all questioned fully answered. Patient awaiting ride home. States she is going out to smoke a cigarette.   Final diagnoses:  Contusion of sacrum, initial encounter  Fall at home, initial encounter   I personally performed the services described in this documentation, which was scribed in my presence. The recorded information has been reviewed and is accurate.         [place] M Neese, [place] 03/02/15 2138  Doug SouSam Jacubowitz, MD 03/02/15 2259

## 2015-04-05 ENCOUNTER — Inpatient Hospital Stay (HOSPITAL_COMMUNITY)
Admission: EM | Admit: 2015-04-05 | Discharge: 2015-04-08 | DRG: 177 | Disposition: A | Discharge status: Home / Self Care | Payer: Self-pay | Attending: Internal Medicine | Admitting: Internal Medicine

## 2015-04-05 ENCOUNTER — Encounter (HOSPITAL_COMMUNITY): Payer: Self-pay | Admitting: Emergency Medicine

## 2015-04-05 ENCOUNTER — Emergency Department (HOSPITAL_COMMUNITY): Payer: Self-pay

## 2015-04-05 ENCOUNTER — Other Ambulatory Visit (HOSPITAL_COMMUNITY): Payer: Self-pay

## 2015-04-05 DIAGNOSIS — J850 Gangrene and necrosis of lung: Principal | ICD-10-CM | POA: Diagnosis present

## 2015-04-05 DIAGNOSIS — E43 Unspecified severe protein-calorie malnutrition: Secondary | ICD-10-CM | POA: Diagnosis present

## 2015-04-05 DIAGNOSIS — D649 Anemia, unspecified: Secondary | ICD-10-CM | POA: Diagnosis present

## 2015-04-05 DIAGNOSIS — J441 Chronic obstructive pulmonary disease with (acute) exacerbation: Secondary | ICD-10-CM | POA: Diagnosis present

## 2015-04-05 DIAGNOSIS — W19XXXA Unspecified fall, initial encounter: Secondary | ICD-10-CM | POA: Diagnosis present

## 2015-04-05 DIAGNOSIS — J189 Pneumonia, unspecified organism: Secondary | ICD-10-CM

## 2015-04-05 DIAGNOSIS — Z681 Body mass index (BMI) 19 or less, adult: Secondary | ICD-10-CM

## 2015-04-05 DIAGNOSIS — F17210 Nicotine dependence, cigarettes, uncomplicated: Secondary | ICD-10-CM | POA: Diagnosis present

## 2015-04-05 LAB — LACTIC ACID, PLASMA: Lactic Acid, Venous: 1.5 mmol/L (ref 0.5–2.0)

## 2015-04-05 LAB — CBC WITH DIFFERENTIAL/PLATELET
BASOS PCT: 0 % (ref 0–1)
Basophils Absolute: 0 10*3/uL (ref 0.0–0.1)
Eosinophils Absolute: 0 10*3/uL (ref 0.0–0.7)
Eosinophils Relative: 0 % (ref 0–5)
HCT: 26.7 % — ABNORMAL LOW (ref 36.0–46.0)
Hemoglobin: 8.5 g/dL — ABNORMAL LOW (ref 12.0–15.0)
LYMPHS ABS: 1.8 10*3/uL (ref 0.7–4.0)
Lymphocytes Relative: 15 % (ref 12–46)
MCH: 27.3 pg (ref 26.0–34.0)
MCHC: 31.8 g/dL (ref 30.0–36.0)
MCV: 85.9 fL (ref 78.0–100.0)
Monocytes Absolute: 0.8 10*3/uL (ref 0.1–1.0)
Monocytes Relative: 7 % (ref 3–12)
NEUTROS ABS: 9.3 10*3/uL — AB (ref 1.7–7.7)
NEUTROS PCT: 78 % — AB (ref 43–77)
Platelets: 504 10*3/uL — ABNORMAL HIGH (ref 150–400)
RBC: 3.11 MIL/uL — AB (ref 3.87–5.11)
RDW: 17.6 % — ABNORMAL HIGH (ref 11.5–15.5)
WBC: 12 10*3/uL — ABNORMAL HIGH (ref 4.0–10.5)

## 2015-04-05 LAB — BASIC METABOLIC PANEL
Anion gap: 8 (ref 5–15)
BUN: 9 mg/dL (ref 6–20)
CO2: 24 mmol/L (ref 22–32)
Calcium: 8.5 mg/dL — ABNORMAL LOW (ref 8.9–10.3)
Chloride: 112 mmol/L — ABNORMAL HIGH (ref 101–111)
Creatinine, Ser: 0.57 mg/dL (ref 0.44–1.00)
GFR calc non Af Amer: >60 mL/min (ref >60)
Glucose, Bld: 94 mg/dL (ref 65–99)
POTASSIUM: 3.4 mmol/L — AB (ref 3.5–5.1)
SODIUM: 144 mmol/L (ref 135–145)

## 2015-04-05 LAB — TROPONIN I

## 2015-04-05 MED ORDER — HEPARIN SODIUM (PORCINE) 5000 UNIT/ML IJ SOLN
5000.0000 [IU] | Freq: Three times a day (TID) | INTRAMUSCULAR | Status: DC
  Administered 2015-04-05 – 2015-04-08 (×6): 5000 [IU] via SUBCUTANEOUS
  Filled 2015-04-05 (×8): qty 1

## 2015-04-05 MED ORDER — IOHEXOL 350 MG/ML SOLN
100.0000 mL | Freq: Once | INTRAVENOUS | Status: AC | PRN
  Administered 2015-04-05: 100 mL via INTRAVENOUS

## 2015-04-05 MED ORDER — LEVOFLOXACIN IN D5W 500 MG/100ML IV SOLN
500.0000 mg | Freq: Once | INTRAVENOUS | Status: AC
  Administered 2015-04-05: 500 mg via INTRAVENOUS
  Filled 2015-04-05: qty 100

## 2015-04-05 MED ORDER — HEPARIN SODIUM (PORCINE) 5000 UNIT/ML IJ SOLN: 5000.0000 [IU] | Freq: Three times a day (TID) | INTRAMUSCULAR | Status: DC

## 2015-04-05 MED ORDER — DEXTROSE 5 % IV SOLN
500.0000 mg | INTRAVENOUS | Status: DC
  Administered 2015-04-05: 500 mg via INTRAVENOUS
  Filled 2015-04-05: qty 500

## 2015-04-05 MED ORDER — ALBUTEROL SULFATE HFA 108 (90 BASE) MCG/ACT IN AERS
2.0000 | INHALATION_SPRAY | Freq: Four times a day (QID) | RESPIRATORY_TRACT | Status: DC | PRN
  Filled 2015-04-05: qty 6.7

## 2015-04-05 MED ORDER — ALBUTEROL SULFATE (2.5 MG/3ML) 0.083% IN NEBU: 3.0000 mL | INHALATION_SOLUTION | Freq: Four times a day (QID) | RESPIRATORY_TRACT | Status: DC | PRN

## 2015-04-05 MED ORDER — ZOLPIDEM TARTRATE 5 MG PO TABS
5.0000 mg | ORAL_TABLET | Freq: Every evening | ORAL | Status: DC | PRN
  Administered 2015-04-05 – 2015-04-07 (×3): 5 mg via ORAL
  Filled 2015-04-05 (×3): qty 1

## 2015-04-05 MED ORDER — HYDROCODONE-ACETAMINOPHEN 5-325 MG PO TABS
1.0000 | ORAL_TABLET | Freq: Once | ORAL | Status: AC
Start: 2015-04-05 — End: 2015-04-05
  Administered 2015-04-05: 1 via ORAL
  Filled 2015-04-05: qty 1

## 2015-04-05 MED ORDER — DEXTROSE 5 % IV SOLN
1.0000 g | INTRAVENOUS | Status: DC
  Administered 2015-04-05: 1 g via INTRAVENOUS
  Filled 2015-04-05: qty 10

## 2015-04-05 MED ORDER — OXYCODONE HCL 5 MG PO TABS
5.0000 mg | ORAL_TABLET | Freq: Four times a day (QID) | ORAL | Status: DC | PRN
  Administered 2015-04-05 – 2015-04-08 (×11): 5 mg via ORAL
  Filled 2015-04-05 (×11): qty 1

## 2015-04-05 MED ORDER — ENSURE ENLIVE PO LIQD
237.0000 mL | Freq: Two times a day (BID) | ORAL | Status: DC
  Administered 2015-04-06 – 2015-04-08 (×6): 237 mL via ORAL

## 2015-04-05 NOTE — Progress Notes (Signed)
Pt requests something to help her sleep. Text paged hospitalist, awaiting response.

## 2015-04-05 NOTE — ED Notes (Signed)
Pt states that she tried to catch someone in a wheelchair that was going down a ramp and it pulled her right shoulder.  States hit axilla on railing.

## 2015-04-05 NOTE — ED Provider Notes (Signed)
CSN: [id number]     Arrival date & time 04/05/15  1055 History  This chart was scribed for Pauline Ausammy Shaketa Serafin, PA-C, working with Glynn OctaveStephen Rancour, MD by Leona CarryG. Clay Sherrill, ED Scribe. The patient was seen in APFT23/APFT23. The patient's care was started at 11:19 AM.     Chief Complaint  Patient presents with  . Shoulder Pain    HPI HPI Comments: Joanna Stephens is a 49 y.o. female who presents to the Emergency Department complaining of right lateral rib pain beginning this morning.  Patient reports that she fell and struck the right side of her ribcage on a rail.  Patient denies shoulder pain, neck pain or LOC. She reports pain with inspiration, palpation of the right lateral chest and coughing. She also complains of SOB with exertion.  On further questioning, She states that she was seen here one month ago and diagnosed with bronchitis, given a Z-pack which she completed.  Denies any improvement in her chronic cough.  Continues to smoke.  She denies fever, chills, vomiting, bloody sputum or abdominal pain.    Patient does not have a PCP.  Past Medical History  Diagnosis Date  . Bronchitis   . Peptic ulcer   . Chronic back pain   . Chronic chest wall pain   . Chronic cough   . Encounter for blood transfusion   . Anemia   . Vertigo    Past Surgical History  Procedure Laterality Date  . Abdominal hysterectomy    . Cholecystectomy    . Tubal ligation    . Ulcer surgery     History reviewed. No pertinent family history. History  Substance Use Topics  . Smoking status: Current Every Day Smoker -- 1.00 packs/day for 30 years    Types: Cigarettes  . Smokeless tobacco: Never Used  . Alcohol Use: Yes   OB History    Gravida Para Term Preterm AB TAB SAB Ectopic Multiple Living   2 1  1 1  1   1      Review of Systems  Constitutional: Negative for fever and chills.  HENT: Negative for trouble swallowing.   Respiratory: Positive for shortness of breath. Negative for chest tightness,  wheezing and stridor.   Cardiovascular: Positive for chest pain.       Right lateral rib pain  Gastrointestinal: Negative for nausea, vomiting and abdominal pain.  Genitourinary: Negative for dysuria, hematuria and difficulty urinating.  Musculoskeletal: Negative for joint swelling.  Skin: Negative for color change and wound.  Neurological: Negative for dizziness, syncope, weakness and numbness.  All other systems reviewed and are negative.     Allergies  Ultracet and Ultram  Home Medications   Prior to Admission medications   Medication Sig Start Date End Date Taking? Authorizing Provider  albuterol (PROVENTIL HFA;VENTOLIN HFA) 108 (90 BASE) MCG/ACT inhaler Inhale 2 puffs into the lungs every 6 (six) hours as needed for shortness of breath.   Yes Historical Provider, MD  aspirin 325 MG tablet Take 325 mg by mouth 2 (two) times daily as needed (pain).   Yes Historical Provider, MD  azithromycin (ZITHROMAX) 250 MG tablet Take one tablet a day,  Start on monday Patient not taking: Reported on 04/05/2015 02/21/15   Bethann BerkshireJoseph Zammit, MD  diclofenac (VOLTAREN) 50 MG EC tablet Take 1 tablet (50 mg total) by mouth 2 (two) times daily. Patient not taking: Reported on 04/05/2015 03/02/15   Janne NapoleonHope M Neese, NP  HYDROcodone-acetaminophen (NORCO/VICODIN) 5-325 MG per tablet Take 1  tablet by mouth every 6 (six) hours as needed for moderate pain. Patient not taking: Reported on 04/05/2015 02/21/15   Bethann BerkshireJoseph Zammit, MD  meclizine (ANTIVERT) 25 MG tablet Take 1 tablet (25 mg total) by mouth 3 (three) times daily as needed for dizziness. Patient not taking: Reported on 04/05/2015 06/21/14   Samuel JesterKathleen McManus, DO   Triage Vitals: BP 101/52 mmHg  Pulse 86  Temp(Src) 97.3 F (36.3 C) (Oral)  Resp 18  Ht 5\' 3"  (1.6 m)  Wt 96 lb (43.545 kg)  BMI 17.01 kg/m2  SpO2 100% Physical Exam  Constitutional: She is oriented to person, place, and time. She appears well-developed and well-nourished. No distress.  HENT:  Head:  Normocephalic and atraumatic.  Mouth/Throat: Oropharynx is clear and moist.  Eyes: Conjunctivae and EOM are normal.  Neck: Normal range of motion. Neck supple. No tracheal deviation present.  Nontender.  Cardiovascular: Normal rate, regular rhythm and normal heart sounds.   No murmur heard. Pulmonary/Chest: Effort normal. No respiratory distress. She has no wheezes.  Diffuse tenderness to the right lateral chest wall. No bony deformity or crepitus. Coarse lung sounds bilaterally.  Abdominal: Soft. She exhibits no distension. There is no tenderness. There is no rebound.  Musculoskeletal: Normal range of motion.  Neurological: She is alert and oriented to person, place, and time.  Skin: Skin is warm and dry. No bruising noted.  No ecchymosis or abrasions to the right chest wall.    Psychiatric: She has a normal mood and affect. Her behavior is normal.  Nursing note and vitals reviewed.   ED Course  Procedures (including critical care time) DIAGNOSTIC STUDIES: Oxygen Saturation is 100% on room air, normal by my interpretation.    COORDINATION OF CARE: 1:17 PM - Patient was seen by Dr. Manus Gunningancour.    Labs Review Labs Reviewed  CBC WITH DIFFERENTIAL/PLATELET - Abnormal; Notable for the following:    WBC 12.0 (*)    RBC 3.11 (*)    Hemoglobin 8.5 (*)    HCT 26.7 (*)    RDW 17.6 (*)    Platelets 504 (*)    Neutrophils Relative % 78 (*)    Neutro Abs 9.3 (*)    All other components within normal limits  BASIC METABOLIC PANEL - Abnormal; Notable for the following:    Potassium 3.4 (*)    Chloride 112 (*)    Calcium 8.5 (*)    All other components within normal limits  CULTURE, BLOOD (ROUTINE X 2)  CULTURE, BLOOD (ROUTINE X 2)  TROPONIN I  LACTIC ACID, PLASMA  LACTIC ACID, PLASMA    Imaging Review Dg Ribs Unilateral W/chest Right  04/05/2015   CLINICAL DATA:  Right lateral rib pain. Fall this morning. Cough for 2 weeks. Smoker.  EXAM: RIGHT RIBS AND CHEST - 3+ VIEW   COMPARISON:  03/27/2015  FINDINGS: Increasing right upper lobe consolidation compatible with worsening pneumonia. Patchy right lower lobe opacity is similar to prior study. Left lung is clear. Heart is normal size. No acute bony abnormality. No visible rib fracture. No pneumothorax or effusion.  IMPRESSION: Worsening consolidation in the right upper lobe compatible with worsening pneumonia. Stable patchy right lower lobe opacity.  No visible rib fracture, pneumothorax or effusion.   Electronically Signed   By: Charlett NoseKevin  Dover M.D.   On: 04/05/2015 11:41   Ct Angio Chest Pe W/cm &/or Wo Cm  04/05/2015   CLINICAL DATA:  Rib pain, pneumonia  EXAM: CT ANGIOGRAPHY CHEST WITH CONTRAST  TECHNIQUE:  Multidetector CT imaging of the chest was performed using the standard protocol during bolus administration of intravenous contrast. Multiplanar CT image reconstructions and MIPs were obtained to evaluate the vascular anatomy.  CONTRAST:  OMNIPAQUE IOHEXOL 350 MG/ML SOLN  COMPARISON:  06/30/2014 and chest x-ray today  FINDINGS: Images of the thoracic inlet are unremarkable. Central airways are patent. The study is of excellent technical quality. No pulmonary embolus is noted.  Sagittal images of the spine shows no acute fractures. Mild degenerative changes thoracic spine. Sagittal view of the sternum is unremarkable.  No acute rib fractures are identified. There is no scapular fracture.  As noted on today chest x-ray there is consolidation with air bronchogram cystic air filled small cavities in right upper lobe consistent necrotic pneumonia. Additional patchy nodular pneumonia noted in anterior segment of the right upper lobe. Again noted 3 mm nodule in anterior aspect of right lower lobe stable from prior exam. There is a nodule in axial image 137 in right middle lobe measures 5.2 mm. There is progression in size from prior exam when measures 3.7 mm. There is additional patchy infiltrate in right lower lobe see axial  images 53 and 54. Small cystic bronchiectasis is noted in right lower lobe centrally.  Mild perihilar peribronchial thickening. No significant hilar or mediastinal adenopathy. Heart size within normal limits.  The visualized upper abdomen is unremarkable.  Review of the MIP images confirms the above findings.  IMPRESSION: 1. There is consolidation with air bronchogram and cystic air filled spaces consistent with pneumonia with necrosis. This is in lateral segment as noted on chest x-ray. Additional patchy pneumonia is noted in anterior segment. Patchy pneumonia is noted in right lower lobe anterior and laterally. Findings consistent with multifocal bronchopneumonia. Follow-up to resolution after treatment is recommended. 2. Progression in size of nodule in right middle lobe anteriorly measures 5 mm on the prior exam measures 3.7 mm. Stable 3 mm nodule in anterior aspect of the right lower lobe. Follow-up CT scan in 3 months is recommended to assure stability. 3. Bilateral mild perihilar peribronchial thickening. No significant mediastinal adenopathy. 4. No pulmonary embolus.   Electronically Signed   By: Natasha Mead M.D.   On: 04/05/2015 15:39     EKG Interpretation None      MDM   Final diagnoses:  Community acquired pneumonia    Patient is non-toxic appearing.  No tachycardia, tachypnea, or hypoxia.  Pt appears older than stated age.  Smoker.  Failed outpatient antibiotic therapy from one month ago, CXR neg for fx, but findings c/w PNA.  Pt seen by EDP, Dr. Manus Gunning and additional labs and CTA ordered.    Results reviewed, IV Levaquin ordered , will consult hospitalist for admission.  Blood cultures and lactic acid pending   Patient has ambulated in the dept maintained O2 sat 96-100% on RA. Although complains of dyspnea   1630  Consulted Dr. Karilyn Cota who agrees to admit and will see pt in ED.     I personally performed the services described in this documentation, which was scribed in my  presence. The recorded information has been reviewed and is accurate.    Pauline Aus, PA-C 04/05/15 1647  Glynn Octave, MD 04/05/15 [phone]

## 2015-04-05 NOTE — ED Notes (Signed)
ra sats on ambulation 96-100%

## 2015-04-05 NOTE — H&P (Signed)
[facility] History and Physical  Joanna Stephens ZOX:[id number] DOB: 21-Feb-1966 DOA: 04/05/2015  Referring physician: ER PCP: No PCP Per Patient   Chief Complaint: Dyspnea  HPI: Joanna Stephens is a 49 y.o. female  This is a 63 year old lady who, 2 months ago, had dry cough associated with increasing dyspnea but no fever. She was seen in the emergency room and diagnosed with bronchitis and given oral antibiotics. During this time she is not improved significantly, has lost apparently 25 pounds in weight and returns because she had an accidental fall and injured her right arm. When she was seen in the emergency room today x-rays were done to look at the ribs. There was no rib fractures but there appeared to be consolidation in the right upper lobe compatible with pneumonia. She then underwent CT angiogram of the chest and this, surprisingly, shows air bronchogram and cystic air-filled spaces consistent with pneumonia with necrosis in the right lung. There appears to be a multifocal pneumonia pattern. Fortunately, she has not required any supplemental oxygen so far. She is now being admitted for further management.   Review of Systems:  Apart from symptoms above, all systems negative.  Past Medical History  Diagnosis Date  . Bronchitis   . Peptic ulcer   . Chronic back pain   . Chronic chest wall pain   . Chronic cough   . Encounter for blood transfusion   . Anemia   . Vertigo    Past Surgical History  Procedure Laterality Date  . Abdominal hysterectomy    . Cholecystectomy    . Tubal ligation    . Ulcer surgery     Social History:  reports that she has been smoking Cigarettes.  She has a 30 pack-year smoking history. She has never used smokeless tobacco. She reports that she drinks alcohol. She reports that she does not use illicit drugs.  Allergies  Allergen Reactions  . Ultracet [Tramadol-Acetaminophen] Hives and Nausea And Vomiting  . Ultram [Tramadol] Nausea And  Vomiting     Family history: There is no family history of lung disease.  Prior to Admission medications   Medication Sig Start Date End Date Taking? Authorizing Provider  albuterol (PROVENTIL HFA;VENTOLIN HFA) 108 (90 BASE) MCG/ACT inhaler Inhale 2 puffs into the lungs every 6 (six) hours as needed for shortness of breath.   Yes Historical Provider, MD  aspirin 325 MG tablet Take 325 mg by mouth 2 (two) times daily as needed (pain).   Yes Historical Provider, MD  azithromycin (ZITHROMAX) 250 MG tablet Take one tablet a day,  Start on monday Patient not taking: Reported on 04/05/2015 02/21/15   Bethann Berkshire, MD  diclofenac (VOLTAREN) 50 MG EC tablet Take 1 tablet (50 mg total) by mouth 2 (two) times daily. Patient not taking: Reported on 04/05/2015 03/02/15   Janne Napoleon, NP  HYDROcodone-acetaminophen (NORCO/VICODIN) 5-325 MG per tablet Take 1 tablet by mouth every 6 (six) hours as needed for moderate pain. Patient not taking: Reported on 04/05/2015 02/21/15   Bethann Berkshire, MD  meclizine (ANTIVERT) 25 MG tablet Take 1 tablet (25 mg total) by mouth 3 (three) times daily as needed for dizziness. Patient not taking: Reported on 04/05/2015 06/21/14   Samuel Jester, DO   Physical Exam: Filed Vitals:   04/05/15 1101 04/05/15 1540 04/05/15 1629  BP: 101/52 91/51 103/64  Pulse: 86 65 65  Temp: 97.3 F (36.3 C)    TempSrc: Oral    Resp: 18 18 20  Height: 5\' 3"  (1.6 m)    Weight: 43.545 kg (96 lb)    SpO2: 100% 99% 100%    Wt Readings from Last 3 Encounters:  04/05/15 43.545 kg (96 lb)  03/02/15 43.545 kg (96 lb)  02/21/15 43.863 kg (96 lb 11.2 oz)    General:  Appears calm and comfortable. She is not septic or toxic. She does look thin possibly cachectic. There is no clubbing. Eyes: PERRL, normal lids, irises & conjunctiva ENT: grossly normal hearing, lips & tongue Neck: no LAD, masses or thyromegaly. No lymphadenopathy in the supraclavicular or neck area. Cardiovascular: RRR, no m/r/g.  No LE edema. Telemetry: SR, no arrhythmias  Respiratory: Poor air entry bilaterally, consistent with COPD. There are no crackles or wheezing or bronchial breathing. Abdomen: soft, ntnd Skin: no rash or induration seen on limited exam Musculoskeletal: grossly normal tone BUE/BLE Psychiatric: grossly normal mood and affect, speech fluent and appropriate Neurologic: grossly non-focal.          Labs on Admission:  Basic Metabolic Panel:  Recent Labs Lab 04/05/15 1235  NA 144  K 3.4*  CL 112*  CO2 24  GLUCOSE 94  BUN 9  CREATININE 0.57  CALCIUM 8.5*   Liver Function Tests: No results for input(s): AST, ALT, ALKPHOS, BILITOT, PROT, ALBUMIN in the last 168 hours. No results for input(s): LIPASE, AMYLASE in the last 168 hours. No results for input(s): AMMONIA in the last 168 hours. CBC:  Recent Labs Lab 04/05/15 1235  WBC 12.0*  NEUTROABS 9.3*  HGB 8.5*  HCT 26.7*  MCV 85.9  PLT 504*   Cardiac Enzymes:  Recent Labs Lab 04/05/15 1235  TROPONINI <0.03    BNP (last 3 results) No results for input(s): BNP in the last 8760 hours.  ProBNP (last 3 results)  Recent Labs  05/16/14 1327 09/29/14 1330  PROBNP 53.1 82.7    CBG: No results for input(s): GLUCAP in the last 168 hours.  Radiological Exams on Admission: Dg Ribs Unilateral W/chest Right  04/05/2015   CLINICAL DATA:  Right lateral rib pain. Fall this morning. Cough for 2 weeks. Smoker.  EXAM: RIGHT RIBS AND CHEST - 3+ VIEW  COMPARISON:  03/27/2015  FINDINGS: Increasing right upper lobe consolidation compatible with worsening pneumonia. Patchy right lower lobe opacity is similar to prior study. Left lung is clear. Heart is normal size. No acute bony abnormality. No visible rib fracture. No pneumothorax or effusion.  IMPRESSION: Worsening consolidation in the right upper lobe compatible with worsening pneumonia. Stable patchy right lower lobe opacity.  No visible rib fracture, pneumothorax or effusion.    Electronically Signed   By: Charlett NoseKevin  Dover M.D.   On: 04/05/2015 11:41   Ct Angio Chest Pe W/cm &/or Wo Cm  04/05/2015   CLINICAL DATA:  Rib pain, pneumonia  EXAM: CT ANGIOGRAPHY CHEST WITH CONTRAST  TECHNIQUE: Multidetector CT imaging of the chest was performed using the standard protocol during bolus administration of intravenous contrast. Multiplanar CT image reconstructions and MIPs were obtained to evaluate the vascular anatomy.  CONTRAST:  100mL OMNIPAQUE IOHEXOL 350 MG/ML SOLN  COMPARISON:  06/30/2014 and chest x-ray today  FINDINGS: Images of the thoracic inlet are unremarkable. Central airways are patent. The study is of excellent technical quality. No pulmonary embolus is noted.  Sagittal images of the spine shows no acute fractures. Mild degenerative changes thoracic spine. Sagittal view of the sternum is unremarkable.  No acute rib fractures are identified. There is no scapular fracture.  As  noted on today chest x-ray there is consolidation with air bronchogram cystic air filled small cavities in right upper lobe consistent necrotic pneumonia. Additional patchy nodular pneumonia noted in anterior segment of the right upper lobe. Again noted 3 mm nodule in anterior aspect of right lower lobe stable from prior exam. There is a nodule in axial image 137 in right middle lobe measures 5.2 mm. There is progression in size from prior exam when measures 3.7 mm. There is additional patchy infiltrate in right lower lobe see axial images 53 and 54. Small cystic bronchiectasis is noted in right lower lobe centrally.  Mild perihilar peribronchial thickening. No significant hilar or mediastinal adenopathy. Heart size within normal limits.  The visualized upper abdomen is unremarkable.  Review of the MIP images confirms the above findings.  IMPRESSION: 1. There is consolidation with air bronchogram and cystic air filled spaces consistent with pneumonia with necrosis. This is in lateral segment as noted on chest x-ray.  Additional patchy pneumonia is noted in anterior segment. Patchy pneumonia is noted in right lower lobe anterior and laterally. Findings consistent with multifocal bronchopneumonia. Follow-up to resolution after treatment is recommended. 2. Progression in size of nodule in right middle lobe anteriorly measures 5 mm on the prior exam measures 3.7 mm. Stable 3 mm nodule in anterior aspect of the right lower lobe. Follow-up CT scan in 3 months is recommended to assure stability. 3. Bilateral mild perihilar peribronchial thickening. No significant mediastinal adenopathy. 4. No pulmonary embolus.   Electronically Signed   By: Natasha MeadLiviu  Pop M.D.   On: 04/05/2015 15:39      Assessment/Plan   1. Community-acquired pneumonia. There is significant multifocal bronchopneumonia seen on CT chest scan but this does not seem to coincide with her symptoms. Nonetheless, we will treat her with intravenous antibiotics and ask pulmonology to review her to see if they feel that further investigations are warranted. I'm concerned about the 25 pound weight loss over the last couple of months. This would indicate the possibility of underlying malignancy, especially in view of her smoking history. 2. COPD. This appears to be stable without exacerbation at this point.  Further recommendations will depend on patient's hospital progress.  Code Status: Full code.  DVT Prophylaxis: Heparin  Family Communication: I discussed the plan with the patient at the bedside.   Disposition Plan: Home when medically stable.  Time spent: 60 minutes.  Wilson SingerGOSRANI,NIMISH C [facility] Pager [phone].

## 2015-04-05 NOTE — ED Provider Notes (Signed)
ED ECG REPORT   Date: 04/05/2015  Rate: 69  Rhythm: normal sinus rhythm  QRS Axis: normal  Intervals: normal  ST/T Wave abnormalities: nonspecific ST/T changes  Conduction Disutrbances:none  Narrative Interpretation:   Old EKG Reviewed: unchanged  I have personally reviewed the EKG tracing and agree with the computerized printout as noted.   Glynn OctaveStephen Lametria Klunk, MD 04/05/15 [phone]

## 2015-04-06 DIAGNOSIS — R634 Abnormal weight loss: Secondary | ICD-10-CM

## 2015-04-06 LAB — HIV ANTIBODY (ROUTINE TESTING W REFLEX): HIV Screen 4th Generation wRfx: NONREACTIVE

## 2015-04-06 LAB — COMPREHENSIVE METABOLIC PANEL
ALT: 16 U/L (ref 14–54)
AST: 18 U/L (ref 15–41)
Albumin: 2.2 g/dL — ABNORMAL LOW (ref 3.5–5.0)
Alkaline Phosphatase: 138 U/L — ABNORMAL HIGH (ref 38–126)
Anion gap: 8 (ref 5–15)
BUN: 6 mg/dL (ref 6–20)
CO2: 24 mmol/L (ref 22–32)
CREATININE: 0.55 mg/dL (ref 0.44–1.00)
Calcium: 8.4 mg/dL — ABNORMAL LOW (ref 8.9–10.3)
Chloride: 111 mmol/L (ref 101–111)
GLUCOSE: 76 mg/dL (ref 65–99)
Potassium: 3.3 mmol/L — ABNORMAL LOW (ref 3.5–5.1)
SODIUM: 143 mmol/L (ref 135–145)
Total Bilirubin: 0.2 mg/dL — ABNORMAL LOW (ref 0.3–1.2)
Total Protein: 6.2 g/dL — ABNORMAL LOW (ref 6.5–8.1)

## 2015-04-06 LAB — CBC
HEMATOCRIT: 28.7 % — AB (ref 36.0–46.0)
HEMOGLOBIN: 8.9 g/dL — AB (ref 12.0–15.0)
MCH: 27 pg (ref 26.0–34.0)
MCHC: 31 g/dL (ref 30.0–36.0)
MCV: 87 fL (ref 78.0–100.0)
Platelets: 537 10*3/uL — ABNORMAL HIGH (ref 150–400)
RBC: 3.3 MIL/uL — ABNORMAL LOW (ref 3.87–5.11)
RDW: 17.7 % — ABNORMAL HIGH (ref 11.5–15.5)
WBC: 12.2 10*3/uL — AB (ref 4.0–10.5)

## 2015-04-06 LAB — STREP PNEUMONIAE URINARY ANTIGEN: Strep Pneumo Urinary Antigen: NEGATIVE

## 2015-04-06 MED ORDER — ALBUTEROL SULFATE (2.5 MG/3ML) 0.083% IN NEBU
3.0000 mL | INHALATION_SOLUTION | Freq: Four times a day (QID) | RESPIRATORY_TRACT | Status: DC
  Administered 2015-04-07 – 2015-04-08 (×7): 3 mL via RESPIRATORY_TRACT
  Filled 2015-04-06 (×8): qty 3

## 2015-04-06 MED ORDER — VANCOMYCIN HCL IN DEXTROSE 1-5 GM/200ML-% IV SOLN
1000.0000 mg | INTRAVENOUS | Status: DC
  Administered 2015-04-06 – 2015-04-08 (×3): 1000 mg via INTRAVENOUS
  Filled 2015-04-06 (×4): qty 200

## 2015-04-06 MED ORDER — GUAIFENESIN ER 600 MG PO TB12
1200.0000 mg | ORAL_TABLET | Freq: Two times a day (BID) | ORAL | Status: DC
  Administered 2015-04-06 – 2015-04-08 (×5): 1200 mg via ORAL
  Filled 2015-04-06 (×5): qty 2

## 2015-04-06 MED ORDER — ONDANSETRON HCL 4 MG/2ML IJ SOLN
4.0000 mg | Freq: Four times a day (QID) | INTRAMUSCULAR | Status: DC
  Administered 2015-04-06 – 2015-04-08 (×9): 4 mg via INTRAVENOUS
  Filled 2015-04-06 (×9): qty 2

## 2015-04-06 MED ORDER — PNEUMOCOCCAL VAC POLYVALENT 25 MCG/0.5ML IJ INJ
0.5000 mL | INJECTION | INTRAMUSCULAR | Status: AC
  Administered 2015-04-07: 0.5 mL via INTRAMUSCULAR
  Filled 2015-04-06: qty 0.5

## 2015-04-06 MED ORDER — ALBUTEROL SULFATE (2.5 MG/3ML) 0.083% IN NEBU: 2.5000 mg | INHALATION_SOLUTION | RESPIRATORY_TRACT | Status: DC | PRN

## 2015-04-06 MED ORDER — ALBUTEROL SULFATE (2.5 MG/3ML) 0.083% IN NEBU
3.0000 mL | INHALATION_SOLUTION | RESPIRATORY_TRACT | Status: DC
  Administered 2015-04-06 (×3): 3 mL via RESPIRATORY_TRACT
  Filled 2015-04-06 (×3): qty 3

## 2015-04-06 MED ORDER — PIPERACILLIN-TAZOBACTAM 3.375 G IVPB
3.3750 g | Freq: Three times a day (TID) | INTRAVENOUS | Status: DC
  Administered 2015-04-06 – 2015-04-08 (×7): 3.375 g via INTRAVENOUS
  Filled 2015-04-06 (×10): qty 50

## 2015-04-06 NOTE — Progress Notes (Signed)
Initial Nutrition Assessment  DOCUMENTATION CODES:  Severe malnutrition in context of acute illness/injury, Underweight  INTERVENTION: Ensure Enlive po BID, each supplement provides 350 kcal and 20 grams of protein   NUTRITION DIAGNOSIS:  Inadequate oral intake related to inability to eat as evidenced by per patient/family report.   GOAL:  Patient will meet greater than or equal to 90% of their needs   MONITOR:  PO intake, Supplement acceptance, Labs, Weight trends  REASON FOR ASSESSMENT:  Malnutrition Screening Tool    ASSESSMENT: Pt feeling better today and says she ate some breakfast this morning. She complains though that for the past 2-3 weeks she's had very little solid foods and has been living mostly on fluids: Pepsi, Cokes, and orange juice. Based on hospital records her weight is down 8.5% x 60 days which is significant. She has multiple areas of muscle wasting and loss of fat mass.   Height:  Ht Readings from Last 1 Encounters:  04/05/15 5\' 3"  (1.6 m)    Weight:  Wt Readings from Last 1 Encounters:  04/05/15 96 lb (43.545 kg)    Ideal Body Weight:   115#   Wt Readings from Last 10 Encounters:  04/05/15 96 lb (43.545 kg)  03/02/15 96 lb (43.545 kg)  02/21/15 96 lb 11.2 oz (43.863 kg)  02/01/15 105 lb (47.628 kg)  09/29/14 105 lb (47.628 kg)  06/21/14 100 lb (45.36 kg)  05/20/14 99 lb (44.906 kg)  10/03/13 100 lb (45.36 kg)  05/09/13 97 lb 1 oz (44.027 kg)  10/25/12 100 lb (45.36 kg)    BMI:  Body mass index is 17.01 kg/(m^2).  Estimated Nutritional Needs:  Kcal:  1540 (35 kcal/kg) to prevent addtional wt loss  Protein:  66-70 gr  Fluid:  1500 ml daily  Skin:   intact  Diet Order:    Regular   EDUCATION NEEDS: None identified at this time    Intake/Output Summary (Last 24 hours) at 04/06/15 1553 Last data filed at 04/06/15 0648  Gross per 24 hour  Intake      0 ml  Output    650 ml  Net   -650 ml    Last BM:   unknown  Royann ShiversLynn Kaaliyah Kita MS,RD,CSG,LDN Office: [phone] Pager: [phone]

## 2015-04-06 NOTE — Progress Notes (Signed)
ANTIBIOTIC CONSULT NOTE  Pharmacy Consult for Vancomycin & Zosyn Indication: pneumonia  Allergies  Allergen Reactions  . Ultracet [Tramadol-Acetaminophen] Hives and Nausea And Vomiting  . Ultram [Tramadol] Nausea And Vomiting    Patient Measurements: Height: 5\' 3"  (160 cm) Weight: 96 lb (43.545 kg) IBW/kg (Calculated) : 52.4  Vital Signs: Temp: 98.1 F (36.7 C) (05/17 0644) Temp Source: Axillary (05/17 0644) BP: 96/59 mmHg (05/17 0644) Pulse Rate: 75 (05/17 0644) Intake/Output from previous day: 05/16 0701 - 05/17 0700 In: -  Out: 650 [Urine:650] Intake/Output from this shift:    Labs:  Recent Labs  04/05/15 1235 04/06/15 0555  WBC 12.0* 12.2*  HGB 8.5* 8.9*  PLT 504* 537*  CREATININE 0.57 0.55   Estimated Creatinine Clearance: 57.8 mL/min (by C-G formula based on Cr of 0.55). No results for input(s): VANCOTROUGH, VANCOPEAK, VANCORANDOM, GENTTROUGH, GENTPEAK, GENTRANDOM, TOBRATROUGH, TOBRAPEAK, TOBRARND, AMIKACINPEAK, AMIKACINTROU, AMIKACIN in the last 72 hours.   Microbiology: No results found for this or any previous visit (from the past 720 hour(s)).  Anti-infectives    Start     Dose/Rate Route Frequency Ordered Stop   04/06/15 1000  piperacillin-tazobactam (ZOSYN) IVPB 3.375 g     3.375 g 12.5 mL/hr over 240 Minutes Intravenous Every 8 hours 04/06/15 0908     04/06/15 1000  vancomycin (VANCOCIN) IVPB 1000 mg/200 mL premix     1,000 mg 200 mL/hr over 60 Minutes Intravenous Every 24 hours 04/06/15 0908     04/05/15 1715  cefTRIAXone (ROCEPHIN) 1 g in dextrose 5 % 50 mL IVPB  Status:  Discontinued     1 g 100 mL/hr over 30 Minutes Intravenous Every 24 hours 04/05/15 1700 04/06/15 0904   04/05/15 1715  azithromycin (ZITHROMAX) 500 mg in dextrose 5 % 250 mL IVPB  Status:  Discontinued     500 mg 250 mL/hr over 60 Minutes Intravenous Every 24 hours 04/05/15 1700 04/06/15 0904   04/05/15 1600  levofloxacin (LEVAQUIN) IVPB 500 mg     500 mg 100 mL/hr over 60  Minutes Intravenous  Once 04/05/15 1552 04/05/15 1738      Assessment: 49 yo F who presented with cough/congestion & started on antibiotics for CAP.  These are being broadened to Vanc/Zosyn per pulmonary due to concern for aspiration vs staph PNA.  Patient is afebrile with normal lactic acid level & mild leukocytosis.   Renal function is at patient's baseline.   Goal of Therapy:  Vancomycin trough level 15-20 mcg/ml  Plan:  Zosyn 3.375gm IV Q8h to be infused over 4hrs Vancomycin 1gm IV q24h Check Vancomycin trough at steady state Monitor renal function and cx data   Elson ClanLilliston, Givanni Staron Michelle 04/06/2015,9:09 AM

## 2015-04-06 NOTE — Progress Notes (Signed)
[facility] PROGRESS NOTE  Joanna Stephens ZOX:[id number] DOB: 26-Jul-1966    PCP:   No PCP Per Patient   HPI: Joanna Stephens is an 49 y.o. female  Non drinker, admitted for PNA with appearance of necrosis, heavy smoker and weight loss.  Concerned for atypical infection and underlying malignancy, PCCM was consulted.  Dr Juanetta Gosling saw her and broaden her antibiotics, as he has suspicion for aspiration pneumonia.  No steroids were added as she is having no evidence of respiratory difficulty.  She denied drug use, but is over due for routine cancer screening, including over due for her colonoscopy.   She is feeling a little better.  She had no major complaints.   Rewiew of Systems:  Constitutional: Negative for malaise, fever and chills. No significant weight loss or weight gain Eyes: Negative for eye pain, redness and discharge, diplopia, visual changes, or flashes of light. ENMT: Negative for ear pain, hoarseness, nasal congestion, sinus pressure and sore throat. No headaches; tinnitus, drooling, or problem swallowing. Cardiovascular: Negative for chest pain, palpitations, diaphoresis, dyspnea and peripheral edema. ; No orthopnea, PND Respiratory: Negative for cough, hemoptysis, wheezing and stridor. No pleuritic chestpain. Gastrointestinal: Negative for nausea, vomiting, diarrhea, constipation, abdominal pain, melena, blood in stool, hematemesis, jaundice and rectal bleeding.    Genitourinary: Negative for frequency, dysuria, incontinence,flank pain and hematuria; Musculoskeletal: Negative for back pain and neck pain. Negative for swelling and trauma.;  Skin: . Negative for pruritus, rash, abrasions, bruising and skin lesion.; ulcerations Neuro: Negative for headache, lightheadedness and neck stiffness. Negative for weakness, altered level of consciousness , altered mental status, extremity weakness, burning feet, involuntary movement, seizure and syncope.  Psych: negative for anxiety,  depression, insomnia, tearfulness, panic attacks, hallucinations, paranoia, suicidal or homicidal ideation    Past Medical History  Diagnosis Date  . Bronchitis   . Peptic ulcer   . Chronic back pain   . Chronic chest wall pain   . Chronic cough   . Encounter for blood transfusion   . Anemia   . Vertigo     Past Surgical History  Procedure Laterality Date  . Abdominal hysterectomy    . Cholecystectomy    . Tubal ligation    . Ulcer surgery      Medications:  HOME MEDS: Prior to Admission medications   Medication Sig Start Date End Date Taking? Authorizing Provider  albuterol (PROVENTIL HFA;VENTOLIN HFA) 108 (90 BASE) MCG/ACT inhaler Inhale 2 puffs into the lungs every 6 (six) hours as needed for shortness of breath.   Yes Historical Provider, MD  aspirin 325 MG tablet Take 325 mg by mouth 2 (two) times daily as needed (pain).   Yes Historical Provider, MD  azithromycin (ZITHROMAX) 250 MG tablet Take one tablet a day,  Start on monday Patient not taking: Reported on 04/05/2015 02/21/15   Bethann Berkshire, MD  diclofenac (VOLTAREN) 50 MG EC tablet Take 1 tablet (50 mg total) by mouth 2 (two) times daily. Patient not taking: Reported on 04/05/2015 03/02/15   Janne Napoleon, NP  HYDROcodone-acetaminophen (NORCO/VICODIN) 5-325 MG per tablet Take 1 tablet by mouth every 6 (six) hours as needed for moderate pain. Patient not taking: Reported on 04/05/2015 02/21/15   Bethann Berkshire, MD  meclizine (ANTIVERT) 25 MG tablet Take 1 tablet (25 mg total) by mouth 3 (three) times daily as needed for dizziness. Patient not taking: Reported on 04/05/2015 06/21/14   Samuel Jester, DO     Allergies:  Allergies  Allergen Reactions  .  Ultracet [Tramadol-Acetaminophen] Hives and Nausea And Vomiting  . Ultram [Tramadol] Nausea And Vomiting    Social History:   reports that she has been smoking Cigarettes.  She has a 30 pack-year smoking history. She has never used smokeless tobacco. She reports that she  does not drink alcohol or use illicit drugs.  Family History: History reviewed. No pertinent family history.   Physical Exam: Filed Vitals:   04/05/15 2002 04/05/15 2031 04/06/15 0644 04/06/15 1056  BP: 81/48 97/55 96/59    Pulse: 70 72 75   Temp: 98.5 F (36.9 C) 98.7 F (37.1 C) 98.1 F (36.7 C)   TempSrc: Oral Oral Axillary   Resp: 20 20 20    Height:      Weight:      SpO2: 100% 100% 100% 100%   Blood pressure 96/59, pulse 75, temperature 98.1 F (36.7 C), temperature source Axillary, resp. rate 20, height 5\' 3"  (1.6 m), weight 43.545 kg (96 lb), SpO2 100 %.  GEN:  Pleasant  patient lying in the stretcher in no acute distress; cooperative with exam. PSYCH:  alert and oriented x4; does not appear anxious or depressed; affect is appropriate. HEENT: Mucous membranes pink and anicteric; PERRLA; EOM intact; no cervical lymphadenopathy nor thyromegaly or carotid bruit; no JVD; There were no stridor. Neck is very supple. Breasts:: Not examined CHEST WALL: No tenderness CHEST: Normal respiration, slight crackles on the right side.  HEART: Regular rate and rhythm.  There are no murmur, rub, or gallops.   BACK: No kyphosis or scoliosis; no CVA tenderness ABDOMEN: soft and non-tender; no masses, no organomegaly, normal abdominal bowel sounds; no pannus; no intertriginous candida. There is no rebound and no distention. Rectal Exam: Not done EXTREMITIES: No bone or joint deformity; age-appropriate arthropathy of the hands and knees; no edema; no ulcerations.  There is no calf tenderness. Genitalia: not examined PULSES: 2+ and symmetric SKIN: Normal hydration no rash or ulceration CNS: Cranial nerves 2-12 grossly intact no focal lateralizing neurologic deficit.  Speech is fluent; uvula elevated with phonation, facial symmetry and tongue midline. DTR are normal bilaterally, cerebella exam is intact, barbinski is negative and strengths are equaled bilaterally.  No sensory loss.   Labs on  Admission:  Basic Metabolic Panel:  Recent Labs Lab 04/05/15 1235 04/06/15 0555  NA 144 143  K 3.4* 3.3*  CL 112* 111  CO2 24 24  GLUCOSE 94 76  BUN 9 6  CREATININE 0.57 0.55  CALCIUM 8.5* 8.4*   Liver Function Tests:  Recent Labs Lab 04/06/15 0555  AST 18  ALT 16  ALKPHOS 138*  BILITOT 0.2*  PROT 6.2*  ALBUMIN 2.2*   No results for input(s): LIPASE, AMYLASE in the last 168 hours. No results for input(s): AMMONIA in the last 168 hours. CBC:  Recent Labs Lab 04/05/15 1235 04/06/15 0555  WBC 12.0* 12.2*  NEUTROABS 9.3*  --   HGB 8.5* 8.9*  HCT 26.7* 28.7*  MCV 85.9 87.0  PLT 504* 537*   Cardiac Enzymes:  Recent Labs Lab 04/05/15 1235  TROPONINI <0.03    Radiological Exams on Admission: Dg Ribs Unilateral W/chest Right  04/05/2015   CLINICAL DATA:  Right lateral rib pain. Fall this morning. Cough for 2 weeks. Smoker.  EXAM: RIGHT RIBS AND CHEST - 3+ VIEW  COMPARISON:  03/27/2015  FINDINGS: Increasing right upper lobe consolidation compatible with worsening pneumonia. Patchy right lower lobe opacity is similar to prior study. Left lung is clear. Heart is normal size. No  acute bony abnormality. No visible rib fracture. No pneumothorax or effusion.  IMPRESSION: Worsening consolidation in the right upper lobe compatible with worsening pneumonia. Stable patchy right lower lobe opacity.  No visible rib fracture, pneumothorax or effusion.   Electronically Signed   By: Charlett NoseKevin  Dover M.D.   On: 04/05/2015 11:41   Ct Angio Chest Pe W/cm &/or Wo Cm  04/05/2015   CLINICAL DATA:  Rib pain, pneumonia  EXAM: CT ANGIOGRAPHY CHEST WITH CONTRAST  TECHNIQUE: Multidetector CT imaging of the chest was performed using the standard protocol during bolus administration of intravenous contrast. Multiplanar CT image reconstructions and MIPs were obtained to evaluate the vascular anatomy.  CONTRAST:  100mL OMNIPAQUE IOHEXOL 350 MG/ML SOLN  COMPARISON:  06/30/2014 and chest x-ray today   FINDINGS: Images of the thoracic inlet are unremarkable. Central airways are patent. The study is of excellent technical quality. No pulmonary embolus is noted.  Sagittal images of the spine shows no acute fractures. Mild degenerative changes thoracic spine. Sagittal view of the sternum is unremarkable.  No acute rib fractures are identified. There is no scapular fracture.  As noted on today chest x-ray there is consolidation with air bronchogram cystic air filled small cavities in right upper lobe consistent necrotic pneumonia. Additional patchy nodular pneumonia noted in anterior segment of the right upper lobe. Again noted 3 mm nodule in anterior aspect of right lower lobe stable from prior exam. There is a nodule in axial image 137 in right middle lobe measures 5.2 mm. There is progression in size from prior exam when measures 3.7 mm. There is additional patchy infiltrate in right lower lobe see axial images 53 and 54. Small cystic bronchiectasis is noted in right lower lobe centrally.  Mild perihilar peribronchial thickening. No significant hilar or mediastinal adenopathy. Heart size within normal limits.  The visualized upper abdomen is unremarkable.  Review of the MIP images confirms the above findings.  IMPRESSION: 1. There is consolidation with air bronchogram and cystic air filled spaces consistent with pneumonia with necrosis. This is in lateral segment as noted on chest x-ray. Additional patchy pneumonia is noted in anterior segment. Patchy pneumonia is noted in right lower lobe anterior and laterally. Findings consistent with multifocal bronchopneumonia. Follow-up to resolution after treatment is recommended. 2. Progression in size of nodule in right middle lobe anteriorly measures 5 mm on the prior exam measures 3.7 mm. Stable 3 mm nodule in anterior aspect of the right lower lobe. Follow-up CT scan in 3 months is recommended to assure stability. 3. Bilateral mild perihilar peribronchial thickening. No  significant mediastinal adenopathy. 4. No pulmonary embolus.   Electronically Signed   By: Natasha MeadLiviu  Pop M.D.   On: 04/05/2015 15:39    Assessment/Plan Present on Admission:  . Pneumonia  PLAN:  Will continue with IV Van/Zosyn.  Will check TSH, and she will need follow up imaging after PNA treatment.  She was recommended follow up cancer screening given unintentional weight loss.  She is protein malnurished as well.  She has anemia, and we will check her stool guaics.  She is stable and is a full code.   Other plans as per orders.  Code Status: FULL Unk LightningODE.    Arneta Mahmood, MD. [facility] Pager [phone] 7pm to 7am.  04/06/2015, 11:17 AM

## 2015-04-06 NOTE — Consult Note (Signed)
Consult requested by: [facility] Consult requested for pneumonia:  HPI: This is a 49 year old who came to the emergency department with complaints of rib pain. She had x-rays made of her ribs and she was found to have an extensive infiltrate on the right. She says she's been sick for about 3 weeks or more with cough and congestion. She says she's had multiple bouts of bronchitis. She has an extensive smoking history. She has lost about 20 pounds unintentionally. She's had some chest discomfort. She has not coughed up much sputum. She's not had hemoptysis. She's not had night sweats. No episodes of unconsciousness.  Past Medical History  Diagnosis Date  . Bronchitis   . Peptic ulcer   . Chronic back pain   . Chronic chest wall pain   . Chronic cough   . Encounter for blood transfusion   . Anemia   . Vertigo      History reviewed. No pertinent family history.   History   Social History  . Marital Status: Divorced    Spouse Name: N/A  . Number of Children: N/A  . Years of Education: N/A   Social History Main Topics  . Smoking status: Current Every Day Smoker -- 1.00 packs/day for 30 years    Types: Cigarettes  . Smokeless tobacco: Never Used  . Alcohol Use: No     Comment: Pt states she does not drink anymore.   . Drug Use: No  . Sexual Activity: Yes    Birth Control/ Protection: Surgical   Other Topics Concern  . None   Social History Narrative     ROS: Except as noted is per the history and physical    Objective: Vital signs in last 24 hours: Temp:  [97.3 F (36.3 C)-98.7 F (37.1 C)] 98.1 F (36.7 C) (05/17 0644) Pulse Rate:  [63-86] 75 (05/17 0644) Resp:  [18-20] 20 (05/17 0644) BP: (81-103)/(48-64) 96/59 mmHg (05/17 0644) SpO2:  [99 %-100 %] 100 % (05/17 0644) Weight:  [43.545 kg (96 lb)] 43.545 kg (96 lb) (05/16 1101) Weight change:     Intake/Output from previous day: 05/16 0701 - 05/17 0700 In: -  Out: 650 [Urine:650]  PHYSICAL EXAM She  is very thin. She is coughing during examination. She looks uncomfortable. Her pupils are reactive. Mucous membranes are slightly dry. Her neck is supple without masses. Her chest shows rhonchi and crackles on the right. The left lung shows rhonchi. Her heart is regular without gallop. Her abdomen is soft. She has no edema of the extremities. Central nervous system exam is grossly intact  Lab Results: Basic Metabolic Panel:  Recent Labs  16/10/96 1235 04/06/15 0555  NA 144 143  K 3.4* 3.3*  CL 112* 111  CO2 24 24  GLUCOSE 94 76  BUN 9 6  CREATININE 0.57 0.55  CALCIUM 8.5* 8.4*   Liver Function Tests:  Recent Labs  04/06/15 0555  AST 18  ALT 16  ALKPHOS 138*  BILITOT 0.2*  PROT 6.2*  ALBUMIN 2.2*   No results for input(s): LIPASE, AMYLASE in the last 72 hours. No results for input(s): AMMONIA in the last 72 hours. CBC:  Recent Labs  04/05/15 1235 04/06/15 0555  WBC 12.0* 12.2*  NEUTROABS 9.3*  --   HGB 8.5* 8.9*  HCT 26.7* 28.7*  MCV 85.9 87.0  PLT 504* 537*   Cardiac Enzymes:  Recent Labs  04/05/15 1235  TROPONINI <0.03   BNP: No results for input(s): PROBNP in the last  72 hours. D-Dimer: No results for input(s): DDIMER in the last 72 hours. CBG: No results for input(s): GLUCAP in the last 72 hours. Hemoglobin A1C: No results for input(s): HGBA1C in the last 72 hours. Fasting Lipid Panel: No results for input(s): CHOL, HDL, LDLCALC, TRIG, CHOLHDL, LDLDIRECT in the last 72 hours. Thyroid Function Tests: No results for input(s): TSH, T4TOTAL, FREET4, T3FREE, THYROIDAB in the last 72 hours. Anemia Panel: No results for input(s): VITAMINB12, FOLATE, FERRITIN, TIBC, IRON, RETICCTPCT in the last 72 hours. Coagulation: No results for input(s): LABPROT, INR in the last 72 hours. Urine Drug Screen: Drugs of Abuse  No results found for: LABOPIA, COCAINSCRNUR, LABBENZ, AMPHETMU, THCU, LABBARB  Alcohol Level: No results for input(s): ETH in the last 72  hours. Urinalysis: No results for input(s): COLORURINE, LABSPEC, PHURINE, GLUCOSEU, HGBUR, BILIRUBINUR, KETONESUR, PROTEINUR, UROBILINOGEN, NITRITE, LEUKOCYTESUR in the last 72 hours.  Invalid input(s): APPERANCEUR Misc. Labs:   ABGS: No results for input(s): PHART, PO2ART, TCO2, HCO3 in the last 72 hours.  Invalid input(s): PCO2   MICROBIOLOGY: No results found for this or any previous visit (from the past 240 hour(s)).  Studies/Results: Dg Ribs Unilateral W/chest Right  04/05/2015   CLINICAL DATA:  Right lateral rib pain. Fall this morning. Cough for 2 weeks. Smoker.  EXAM: RIGHT RIBS AND CHEST - 3+ VIEW  COMPARISON:  03/27/2015  FINDINGS: Increasing right upper lobe consolidation compatible with worsening pneumonia. Patchy right lower lobe opacity is similar to prior study. Left lung is clear. Heart is normal size. No acute bony abnormality. No visible rib fracture. No pneumothorax or effusion.  IMPRESSION: Worsening consolidation in the right upper lobe compatible with worsening pneumonia. Stable patchy right lower lobe opacity.  No visible rib fracture, pneumothorax or effusion.   Electronically Signed   By: Charlett NoseKevin  Dover M.D.   On: 04/05/2015 11:41   Ct Angio Chest Pe W/cm &/or Wo Cm  04/05/2015   CLINICAL DATA:  Rib pain, pneumonia  EXAM: CT ANGIOGRAPHY CHEST WITH CONTRAST  TECHNIQUE: Multidetector CT imaging of the chest was performed using the standard protocol during bolus administration of intravenous contrast. Multiplanar CT image reconstructions and MIPs were obtained to evaluate the vascular anatomy.  CONTRAST:  100mL OMNIPAQUE IOHEXOL 350 MG/ML SOLN  COMPARISON:  06/30/2014 and chest x-ray today  FINDINGS: Images of the thoracic inlet are unremarkable. Central airways are patent. The study is of excellent technical quality. No pulmonary embolus is noted.  Sagittal images of the spine shows no acute fractures. Mild degenerative changes thoracic spine. Sagittal view of the sternum  is unremarkable.  No acute rib fractures are identified. There is no scapular fracture.  As noted on today chest x-ray there is consolidation with air bronchogram cystic air filled small cavities in right upper lobe consistent necrotic pneumonia. Additional patchy nodular pneumonia noted in anterior segment of the right upper lobe. Again noted 3 mm nodule in anterior aspect of right lower lobe stable from prior exam. There is a nodule in axial image 137 in right middle lobe measures 5.2 mm. There is progression in size from prior exam when measures 3.7 mm. There is additional patchy infiltrate in right lower lobe see axial images 53 and 54. Small cystic bronchiectasis is noted in right lower lobe centrally.  Mild perihilar peribronchial thickening. No significant hilar or mediastinal adenopathy. Heart size within normal limits.  The visualized upper abdomen is unremarkable.  Review of the MIP images confirms the above findings.  IMPRESSION: 1. There is consolidation  with air bronchogram and cystic air filled spaces consistent with pneumonia with necrosis. This is in lateral segment as noted on chest x-ray. Additional patchy pneumonia is noted in anterior segment. Patchy pneumonia is noted in right lower lobe anterior and laterally. Findings consistent with multifocal bronchopneumonia. Follow-up to resolution after treatment is recommended. 2. Progression in size of nodule in right middle lobe anteriorly measures 5 mm on the prior exam measures 3.7 mm. Stable 3 mm nodule in anterior aspect of the right lower lobe. Follow-up CT scan in 3 months is recommended to assure stability. 3. Bilateral mild perihilar peribronchial thickening. No significant mediastinal adenopathy. 4. No pulmonary embolus.   Electronically Signed   By: Natasha MeadLiviu  Pop M.D.   On: 04/05/2015 15:39    Medications:  Prior to Admission:  Prescriptions prior to admission  Medication Sig Dispense Refill Last Dose  . albuterol (PROVENTIL HFA;VENTOLIN  HFA) 108 (90 BASE) MCG/ACT inhaler Inhale 2 puffs into the lungs every 6 (six) hours as needed for shortness of breath.   Past Week at Unknown time  . aspirin 325 MG tablet Take 325 mg by mouth 2 (two) times daily as needed (pain).   04/05/2015 at Unknown time  . azithromycin (ZITHROMAX) 250 MG tablet Take one tablet a day,  Start on monday (Patient not taking: Reported on 04/05/2015) 4 tablet 0 Not Taking at Unknown time  . diclofenac (VOLTAREN) 50 MG EC tablet Take 1 tablet (50 mg total) by mouth 2 (two) times daily. (Patient not taking: Reported on 04/05/2015) 15 tablet 0 Not Taking at Unknown time  . HYDROcodone-acetaminophen (NORCO/VICODIN) 5-325 MG per tablet Take 1 tablet by mouth every 6 (six) hours as needed for moderate pain. (Patient not taking: Reported on 04/05/2015) 20 tablet 0 Not Taking at Unknown time  . meclizine (ANTIVERT) 25 MG tablet Take 1 tablet (25 mg total) by mouth 3 (three) times daily as needed for dizziness. (Patient not taking: Reported on 04/05/2015) 15 tablet 0 Not Taking at Unknown time   Scheduled: . azithromycin  500 mg Intravenous Q24H  . cefTRIAXone (ROCEPHIN)  IV  1 g Intravenous Q24H  . feeding supplement (ENSURE ENLIVE)  237 mL Oral BID BM  . heparin  5,000 Units Subcutaneous 3 times per day   Continuous:  ZOX:[id number]:albuterol, oxyCODONE, zolpidem  Assesment: She has pneumonia. Although her pneumonia is technically community-acquired this has multiple areas of what may be necrosis. This would be highly suggestive of staphylococcal pneumonia or aspiration pneumonia. I think she is going to need broadened antibiotics. I would like her to have 24 hours of the broadened antibiotics and then I think we should add steroids with the recent information that the addition of steroids to pneumonia may improve the situation. I do think she needs the broadened antibiotics for 24 hours or so first considering the extensive infiltrate. Active Problems:   Pneumonia    Plan: As  above    LOS: 1 day   Joanna Stephens L 04/06/2015, 8:58 AM

## 2015-04-06 NOTE — Care Management Note (Signed)
Case Management Note  Patient Details  Name: Elita BooneVickie H Sumler MRN: [id number] Date of Birth: 02/04/1966  Expected Discharge Date:                  Expected Discharge Plan:  Home/Self Care  In-House Referral:  Financial Counselor  Discharge planning Services  CM Consult  Post Acute Care Choice:  NA Choice offered to:  NA  DME Arranged:    DME Agency:     HH Arranged:    HH Agency:     Status of Service:  In process, will continue to follow  Medicare Important Message Given:    Date Medicare IM Given:    Medicare IM give by:    Date Additional Medicare IM Given:    Additional Medicare Important Message give by:     If discussed at Long Length of Stay Meetings, dates discussed:    Additional Comments: Pt is from home, lives with aunt. Pt has no insurance, PCP, DME's or HH services prior to admission. Pt referred to financial counselor for ins needs. Pt on no chronic meds prior to admission. Pt [facility] [place] [place]. [facility] Pt will require f/u appointment. Will cont to follow for CM needs.   Malcolm Metrohildress, Tsuneo Faison Demske, RN 04/06/2015, 3:42 PM

## 2015-04-07 DIAGNOSIS — J189 Pneumonia, unspecified organism: Secondary | ICD-10-CM

## 2015-04-07 DIAGNOSIS — E43 Unspecified severe protein-calorie malnutrition: Secondary | ICD-10-CM

## 2015-04-07 LAB — LEGIONELLA ANTIGEN, URINE

## 2015-04-07 LAB — BASIC METABOLIC PANEL
ANION GAP: 7 (ref 5–15)
BUN: 10 mg/dL (ref 6–20)
CHLORIDE: 111 mmol/L (ref 101–111)
CO2: 23 mmol/L (ref 22–32)
Calcium: 8.2 mg/dL — ABNORMAL LOW (ref 8.9–10.3)
Creatinine, Ser: 0.68 mg/dL (ref 0.44–1.00)
GFR calc non Af Amer: >60 mL/min (ref >60)
Glucose, Bld: 100 mg/dL — ABNORMAL HIGH (ref 65–99)
Potassium: 3.5 mmol/L (ref 3.5–5.1)
Sodium: 141 mmol/L (ref 135–145)

## 2015-04-07 MED ORDER — METHYLPREDNISOLONE SODIUM SUCC 40 MG IJ SOLR
40.0000 mg | Freq: Two times a day (BID) | INTRAMUSCULAR | Status: DC
  Administered 2015-04-07 – 2015-04-08 (×3): 40 mg via INTRAVENOUS
  Filled 2015-04-07 (×3): qty 1

## 2015-04-07 NOTE — Clinical Documentation Improvement (Signed)
Supporting Information:  Per 5/17 progress note:  She was recommended follow up cancer screening given unintentional weight loss. She is protein malnurished as well.  Per RD Assessment: Severe malnutrition in context of acute illness/injury, Underweight NUTRITION DIAGNOSIS: Inadequate oral intake related to inability to eat as evidenced by per patient/family report. Based on hospital records her weight is down 8.5% x 60 days which is significant. She has multiple areas of muscle wasting and loss of fat mass.  Height 5\' 3"      Weight 96 lbs    BMI  17.01 INTERVENTION: Ensure Enlive po BID, each supplement provides 350 kcal and 20 grams of protein  MONITOR: PO intake, Supplement acceptance, Labs, Weight trends Regular diet    After careful study, could the malnutrition be further specified?  . Severity: --Mild (first degree) protein calorie malnutrition --Moderate (second degree) protein calorie malnutrition --Severe (third degree) protein calorie malnutrion --Other --Unable to determine  . Avoid documenting a range of severity, such as "moderate to severe"  . Document any associated diagnoses/conditions  Thank You,  Leather Estis T. Luiz OchoaWilliams RN, MSN, MBA/MHA Clinical Documentation Specialist [email or web address] Office # [phone]

## 2015-04-07 NOTE — Progress Notes (Signed)
[facility] PROGRESS NOTE  Joanna BooneVickie H Stephens ZOX:[id number] DOB: 02/14/1966 DOA: 04/05/2015 PCP: No PCP Per Patient  Assessment/Plan: Necrotizing pneumonia -She remains afebrile. -Agreed to cover for staph. -Also need to consider aspiration. -She currently is on vancomycin and Zosyn which should be adequate coverage. -Have discussed case via phone with Dr. Algis LimingVandam with infectious disease, he has recommended that patient could potentially be discharged on Zyvox and Flagyl for 3-4 week course. -She will need medication assistance as she does not have insurance. This has been discussed with case management.   Tobacco abuse -Counseled on cessation.  COPD -Does not appear to have an acute exacerbation at present. -Steroids have been added by pulmonology.  Severe protein caloric malnutrition -Ensure twice a day with meals  Code Status: Full code Family Communication: Patient only  Disposition Plan: To be determined   Consultants:  Pulmonary, Dr. Juanetta GoslingHawkins   Antibiotics:  Vancomycin  Zosyn   Subjective: Has no complaints today, specifically no chest pain, or shortness of breath.  Objective: Filed Vitals:   04/07/15 0659 04/07/15 1239 04/07/15 1446 04/07/15 1534  BP:   94/57   Pulse:   80   Temp:   98.4 F (36.9 C)   TempSrc:   Oral   Resp:   20   Height:      Weight:      SpO2: 98% 98% 99% 99%    Intake/Output Summary (Last 24 hours) at 04/07/15 1714 Last data filed at 04/07/15 0906  Gross per 24 hour  Intake    240 ml  Output    550 ml  Net   -310 ml   Filed Weights   04/05/15 1101  Weight: 43.545 kg (96 lb)    Exam:   General:  Alert, awake, oriented 3, cachectic  Cardiovascular: Regular rate and rhythm  Respiratory: Clear to auscultation bilaterally  Abdomen: Soft, nontender, nondistended, positive bowel sounds  Extremities: No clubbing, cyanosis or edema, positive pulses   Neurologic:  Intact and nonfocal  Data Reviewed: Basic  Metabolic Panel:  Recent Labs Lab 04/05/15 1235 04/06/15 0555 04/07/15 0621  NA 144 143 141  K 3.4* 3.3* 3.5  CL 112* 111 111  CO2 24 24 23   GLUCOSE 94 76 100*  BUN 9 6 10   CREATININE 0.57 0.55 0.68  CALCIUM 8.5* 8.4* 8.2*   Liver Function Tests:  Recent Labs Lab 04/06/15 0555  AST 18  ALT 16  ALKPHOS 138*  BILITOT 0.2*  PROT 6.2*  ALBUMIN 2.2*   No results for input(s): LIPASE, AMYLASE in the last 168 hours. No results for input(s): AMMONIA in the last 168 hours. CBC:  Recent Labs Lab 04/05/15 1235 04/06/15 0555  WBC 12.0* 12.2*  NEUTROABS 9.3*  --   HGB 8.5* 8.9*  HCT 26.7* 28.7*  MCV 85.9 87.0  PLT 504* 537*   Cardiac Enzymes:  Recent Labs Lab 04/05/15 1235  TROPONINI <0.03   BNP (last 3 results) No results for input(s): BNP in the last 8760 hours.  ProBNP (last 3 results)  Recent Labs  05/16/14 1327 09/29/14 1330  PROBNP 53.1 82.7    CBG: No results for input(s): GLUCAP in the last 168 hours.  Recent Results (from the past 240 hour(s))  Blood culture (routine x 2)     Status: None (Preliminary result)   Collection Time: 04/05/15  4:02 PM  Result Value Ref Range Status   Specimen Description BLOOD LEFT HAND  Final   Special Requests BOTTLES DRAWN  AEROBIC AND ANAEROBIC 10CC  Final   Culture NO GROWTH 2 DAYS  Final   Report Status PENDING  Incomplete  Blood culture (routine x 2)     Status: None (Preliminary result)   Collection Time: 04/05/15  4:10 PM  Result Value Ref Range Status   Specimen Description BLOOD RIGHT HAND  Final   Special Requests BOTTLES DRAWN AEROBIC AND ANAEROBIC 6CC  Final   Culture NO GROWTH 2 DAYS  Final   Report Status PENDING  Incomplete     Studies: No results found.  Scheduled Meds: . albuterol  3 mL Inhalation QID  . feeding supplement (ENSURE ENLIVE)  237 mL Oral BID BM  . guaiFENesin  1,200 mg Oral BID  . heparin  5,000 Units Subcutaneous 3 times per day  . methylPREDNISolone (SOLU-MEDROL)  injection  40 mg Intravenous Q12H  . ondansetron (ZOFRAN) IV  4 mg Intravenous 4 times per day  . piperacillin-tazobactam (ZOSYN)  IV  3.375 g Intravenous Q8H  . pneumococcal 23 valent vaccine  0.5 mL Intramuscular Tomorrow-1000  . vancomycin  1,000 mg Intravenous Q24H   Continuous Infusions:   Active Problems:   Pneumonia    Time spent: 35 minutes. Greater than 50% of this time was spent in direct contact with the patient coordinating care.    Chaya JanHERNANDEZ ACOSTA,ESTELA  [facility] Pager [phone]  If 7PM-7AM, please contact night-coverage at [email or web address], password [email or web address] 04/07/2015, 5:14 PM  LOS: 2 days

## 2015-04-07 NOTE — Progress Notes (Signed)
Subjective: She says she feels a little better. She is still not able to cough anything up. She has no new complaints.  Objective: Vital signs in last 24 hours: Temp:  [98.6 F (37 C)-99.3 F (37.4 C)] 99 F (37.2 C) (05/18 0541) Pulse Rate:  [76-100] 76 (05/18 0541) Resp:  [18-20] 20 (05/18 0541) BP: (86-100)/(55-74) 100/74 mmHg (05/18 0541) SpO2:  [95 %-100 %] 98 % (05/18 0659) Weight change:     Intake/Output from previous day: 05/17 0701 - 05/18 0700 In: 240 [P.O.:240] Out: 550 [Urine:550]  PHYSICAL EXAM General appearance: alert, cooperative, mild distress and Very thin Resp: rhonchi Bilaterally but the right greater than the left Cardio: regular rate and rhythm, S1, S2 normal, no murmur, click, rub or gallop GI: soft, non-tender; bowel sounds normal; no masses,  no organomegaly Extremities: extremities normal, atraumatic, no cyanosis or edema  Lab Results:  Results for orders placed or performed during the hospital encounter of 04/05/15 (from the past 48 hour(s))  CBC with Differential     Status: Abnormal   Collection Time: 04/05/15 12:35 PM  Result Value Ref Range   WBC 12.0 (H) 4.0 - 10.5 K/uL   RBC 3.11 (Stephens) 3.87 - 5.11 MIL/uL   Hemoglobin 8.5 (Stephens) 12.0 - 15.0 g/dL   HCT 26.7 (Stephens) 36.0 - 46.0 %   MCV 85.9 78.0 - 100.0 fL   MCH 27.3 26.0 - 34.0 pg   MCHC 31.8 30.0 - 36.0 g/dL   RDW 17.6 (H) 11.5 - 15.5 %   Platelets 504 (H) 150 - 400 K/uL   Neutrophils Relative % 78 (H) 43 - 77 %   Neutro Abs 9.3 (H) 1.7 - 7.7 K/uL   Lymphocytes Relative 15 12 - 46 %   Lymphs Abs 1.8 0.7 - 4.0 K/uL   Monocytes Relative 7 3 - 12 %   Monocytes Absolute 0.8 0.1 - 1.0 K/uL   Eosinophils Relative 0 0 - 5 %   Eosinophils Absolute 0.0 0.0 - 0.7 K/uL   Basophils Relative 0 0 - 1 %   Basophils Absolute 0.0 0.0 - 0.1 K/uL  Basic metabolic panel     Status: Abnormal   Collection Time: 04/05/15 12:35 PM  Result Value Ref Range   Sodium 144 135 - 145 mmol/Stephens   Potassium 3.4 (Stephens) 3.5 -  5.1 mmol/Stephens   Chloride 112 (H) 101 - 111 mmol/Stephens   CO2 24 22 - 32 mmol/Stephens   Glucose, Bld 94 65 - 99 mg/dL   BUN 9 6 - 20 mg/dL   Creatinine, Ser 0.57 0.44 - 1.00 mg/dL   Calcium 8.5 (Stephens) 8.9 - 10.3 mg/dL   GFR calc non Af Amer >60 >60 mL/min   GFR calc Af Amer >60 >60 mL/min    Comment: (NOTE) The eGFR has been calculated using the CKD EPI equation. This calculation has not been validated in all clinical situations. eGFR's persistently <60 mL/min signify possible Chronic Kidney Disease.    Anion gap 8 5 - 15  Troponin I     Status: None   Collection Time: 04/05/15 12:35 PM  Result Value Ref Range   Troponin I <0.03 <0.031 ng/mL    Comment:        NO INDICATION OF MYOCARDIAL INJURY.   Lactic acid, plasma     Status: None   Collection Time: 04/05/15  3:57 PM  Result Value Ref Range   Lactic Acid, Venous 1.5 0.5 - 2.0 mmol/Stephens  Blood culture (routine x  2)     Status: None (Preliminary result)   Collection Time: 04/05/15  4:02 PM  Result Value Ref Range   Specimen Description BLOOD LEFT HAND    Special Requests BOTTLES DRAWN AEROBIC AND ANAEROBIC 10CC    Culture NO GROWTH 1 DAY    Report Status PENDING   Blood culture (routine x 2)     Status: None (Preliminary result)   Collection Time: 04/05/15  4:10 PM  Result Value Ref Range   Specimen Description BLOOD RIGHT HAND    Special Requests BOTTLES DRAWN AEROBIC AND ANAEROBIC 6CC    Culture NO GROWTH 1 DAY    Report Status PENDING   HIV antibody     Status: None   Collection Time: 04/05/15  6:01 PM  Result Value Ref Range   HIV Screen 4th Generation wRfx Non Reactive Non Reactive    Comment: (NOTE) Performed At: [facility] [place], [place] [id number] Lindon Romp MD BJ:[id number]   Legionella antigen, urine     Status: None (Preliminary result)   Collection Time: 04/05/15  9:06 PM  Result Value Ref Range   Specimen Description URINE, CLEAN CATCH    Special Requests NONE    Legionella Antigen,  Urine PENDING    Report Status PENDING   Strep pneumoniae urinary antigen     Status: None   Collection Time: 04/05/15  9:06 PM  Result Value Ref Range   Strep Pneumo Urinary Antigen NEGATIVE NEGATIVE    Comment:        Infection due to S. pneumoniae cannot be absolutely ruled out since the antigen present may be below the detection limit of the test. Performed at [facility]   Comprehensive metabolic panel     Status: Abnormal   Collection Time: 04/06/15  5:55 AM  Result Value Ref Range   Sodium 143 135 - 145 mmol/Stephens   Potassium 3.3 (Stephens) 3.5 - 5.1 mmol/Stephens   Chloride 111 101 - 111 mmol/Stephens   CO2 24 22 - 32 mmol/Stephens   Glucose, Bld 76 65 - 99 mg/dL   BUN 6 6 - 20 mg/dL   Creatinine, Ser 0.55 0.44 - 1.00 mg/dL   Calcium 8.4 (Stephens) 8.9 - 10.3 mg/dL   Total Protein 6.2 (Stephens) 6.5 - 8.1 g/dL   Albumin 2.2 (Stephens) 3.5 - 5.0 g/dL   AST 18 15 - 41 U/Stephens   ALT 16 14 - 54 U/Stephens   Alkaline Phosphatase 138 (H) 38 - 126 U/Stephens   Total Bilirubin 0.2 (Stephens) 0.3 - 1.2 mg/dL   GFR calc non Af Amer >60 >60 mL/min   GFR calc Af Amer >60 >60 mL/min    Comment: (NOTE) The eGFR has been calculated using the CKD EPI equation. This calculation has not been validated in all clinical situations. eGFR's persistently <60 mL/min signify possible Chronic Kidney Disease.    Anion gap 8 5 - 15  CBC     Status: Abnormal   Collection Time: 04/06/15  5:55 AM  Result Value Ref Range   WBC 12.2 (H) 4.0 - 10.5 K/uL   RBC 3.30 (Stephens) 3.87 - 5.11 MIL/uL   Hemoglobin 8.9 (Stephens) 12.0 - 15.0 g/dL   HCT 28.7 (Stephens) 36.0 - 46.0 %   MCV 87.0 78.0 - 100.0 fL   MCH 27.0 26.0 - 34.0 pg   MCHC 31.0 30.0 - 36.0 g/dL   RDW 17.7 (H) 11.5 - 15.5 %   Platelets 537 (H) 150 -  400 K/uL  Basic metabolic panel     Status: Abnormal   Collection Time: 04/07/15  6:21 AM  Result Value Ref Range   Sodium 141 135 - 145 mmol/Stephens   Potassium 3.5 3.5 - 5.1 mmol/Stephens   Chloride 111 101 - 111 mmol/Stephens   CO2 23 22 - 32 mmol/Stephens   Glucose, Bld 100 (H) 65 - 99 mg/dL    BUN 10 6 - 20 mg/dL   Creatinine, Ser 0.68 0.44 - 1.00 mg/dL   Calcium 8.2 (Stephens) 8.9 - 10.3 mg/dL   GFR calc non Af Amer >60 >60 mL/min   GFR calc Af Amer >60 >60 mL/min    Comment: (NOTE) The eGFR has been calculated using the CKD EPI equation. This calculation has not been validated in all clinical situations. eGFR's persistently <60 mL/min signify possible Chronic Kidney Disease.    Anion gap 7 5 - 15    ABGS No results for input(s): PHART, PO2ART, TCO2, HCO3 in the last 72 hours.  Invalid input(s): PCO2 CULTURES Recent Results (from the past 240 hour(s))  Blood culture (routine x 2)     Status: None (Preliminary result)   Collection Time: 04/05/15  4:02 PM  Result Value Ref Range Status   Specimen Description BLOOD LEFT HAND  Final   Special Requests BOTTLES DRAWN AEROBIC AND ANAEROBIC 10CC  Final   Culture NO GROWTH 1 DAY  Final   Report Status PENDING  Incomplete  Blood culture (routine x 2)     Status: None (Preliminary result)   Collection Time: 04/05/15  4:10 PM  Result Value Ref Range Status   Specimen Description BLOOD RIGHT HAND  Final   Special Requests BOTTLES DRAWN AEROBIC AND ANAEROBIC 6CC  Final   Culture NO GROWTH 1 DAY  Final   Report Status PENDING  Incomplete   Studies/Results: Dg Ribs Unilateral W/chest Right  04/05/2015   CLINICAL DATA:  Right lateral rib pain. Fall this morning. Cough for 2 weeks. Smoker.  EXAM: RIGHT RIBS AND CHEST - 3+ VIEW  COMPARISON:  03/27/2015  FINDINGS: Increasing right upper lobe consolidation compatible with worsening pneumonia. Patchy right lower lobe opacity is similar to prior study. Left lung is clear. Heart is normal size. No acute bony abnormality. No visible rib fracture. No pneumothorax or effusion.  IMPRESSION: Worsening consolidation in the right upper lobe compatible with worsening pneumonia. Stable patchy right lower lobe opacity.  No visible rib fracture, pneumothorax or effusion.   Electronically Signed   By: Rolm Baptise M.D.   On: 04/05/2015 11:41   Ct Angio Chest Pe W/cm &/or Wo Cm  04/05/2015   CLINICAL DATA:  Rib pain, pneumonia  EXAM: CT ANGIOGRAPHY CHEST WITH CONTRAST  TECHNIQUE: Multidetector CT imaging of the chest was performed using the standard protocol during bolus administration of intravenous contrast. Multiplanar CT image reconstructions and MIPs were obtained to evaluate the vascular anatomy.  CONTRAST:  128m OMNIPAQUE IOHEXOL 350 MG/ML SOLN  COMPARISON:  06/30/2014 and chest x-ray today  FINDINGS: Images of the thoracic inlet are unremarkable. Central airways are patent. The study is of excellent technical quality. No pulmonary embolus is noted.  Sagittal images of the spine shows no acute fractures. Mild degenerative changes thoracic spine. Sagittal view of the sternum is unremarkable.  No acute rib fractures are identified. There is no scapular fracture.  As noted on today chest x-ray there is consolidation with air bronchogram cystic air filled small cavities in right upper lobe consistent necrotic  pneumonia. Additional patchy nodular pneumonia noted in anterior segment of the right upper lobe. Again noted 3 mm nodule in anterior aspect of right lower lobe stable from prior exam. There is a nodule in axial image 137 in right middle lobe measures 5.2 mm. There is progression in size from prior exam when measures 3.7 mm. There is additional patchy infiltrate in right lower lobe see axial images 53 and 54. Small cystic bronchiectasis is noted in right lower lobe centrally.  Mild perihilar peribronchial thickening. No significant hilar or mediastinal adenopathy. Heart size within normal limits.  The visualized upper abdomen is unremarkable.  Review of the MIP images confirms the above findings.  IMPRESSION: 1. There is consolidation with air bronchogram and cystic air filled spaces consistent with pneumonia with necrosis. This is in lateral segment as noted on chest x-ray. Additional patchy pneumonia is  noted in anterior segment. Patchy pneumonia is noted in right lower lobe anterior and laterally. Findings consistent with multifocal bronchopneumonia. Follow-up to resolution after treatment is recommended. 2. Progression in size of nodule in right middle lobe anteriorly measures 5 mm on the prior exam measures 3.7 mm. Stable 3 mm nodule in anterior aspect of the right lower lobe. Follow-up CT scan in 3 months is recommended to assure stability. 3. Bilateral mild perihilar peribronchial thickening. No significant mediastinal adenopathy. 4. No pulmonary embolus.   Electronically Signed   By: Lahoma Crocker M.D.   On: 04/05/2015 15:39    Medications:  Prior to Admission:  Prescriptions prior to admission  Medication Sig Dispense Refill Last Dose  . albuterol (PROVENTIL HFA;VENTOLIN HFA) 108 (90 BASE) MCG/ACT inhaler Inhale 2 puffs into the lungs every 6 (six) hours as needed for shortness of breath.   Past Week at Unknown time  . aspirin 325 MG tablet Take 325 mg by mouth 2 (two) times daily as needed (pain).   04/05/2015 at Unknown time  . azithromycin (ZITHROMAX) 250 MG tablet Take one tablet a day,  Start on monday (Patient not taking: Reported on 04/05/2015) 4 tablet 0 Not Taking at Unknown time  . diclofenac (VOLTAREN) 50 MG EC tablet Take 1 tablet (50 mg total) by mouth 2 (two) times daily. (Patient not taking: Reported on 04/05/2015) 15 tablet 0 Not Taking at Unknown time  . HYDROcodone-acetaminophen (NORCO/VICODIN) 5-325 MG per tablet Take 1 tablet by mouth every 6 (six) hours as needed for moderate pain. (Patient not taking: Reported on 04/05/2015) 20 tablet 0 Not Taking at Unknown time  . meclizine (ANTIVERT) 25 MG tablet Take 1 tablet (25 mg total) by mouth 3 (three) times daily as needed for dizziness. (Patient not taking: Reported on 04/05/2015) 15 tablet 0 Not Taking at Unknown time   Scheduled: . albuterol  3 mL Inhalation QID  . feeding supplement (ENSURE ENLIVE)  237 mL Oral BID BM  .  guaiFENesin  1,200 mg Oral BID  . heparin  5,000 Units Subcutaneous 3 times per day  . ondansetron (ZOFRAN) IV  4 mg Intravenous 4 times per day  . piperacillin-tazobactam (ZOSYN)  IV  3.375 g Intravenous Q8H  . pneumococcal 23 valent vaccine  0.5 mL Intramuscular Tomorrow-1000  . vancomycin  1,000 mg Intravenous Q24H   Continuous:  DVV:OHYWVPXTG, oxyCODONE, zolpidem  Assesment: She was admitted with pneumonia with areas of necrosis suggesting that this may be staph. She is improving with antibiotic changes. This could also be aspiration. She may require prolonged IV antibiotics cause of the severity of her pneumonia and the  necrotic areas. Active Problems:   Pneumonia    Plan: I think it's okay to add steroids now and I will do that. Continue with IV antibiotics    LOS: 2 days   Joanna Stephens 04/07/2015, 8:38 AM

## 2015-04-08 DIAGNOSIS — E43 Unspecified severe protein-calorie malnutrition: Secondary | ICD-10-CM

## 2015-04-08 DIAGNOSIS — J850 Gangrene and necrosis of lung: Principal | ICD-10-CM

## 2015-04-08 LAB — CBC
HCT: 28.6 % — ABNORMAL LOW (ref 36.0–46.0)
Hemoglobin: 8.5 g/dL — ABNORMAL LOW (ref 12.0–15.0)
MCH: 26.3 pg (ref 26.0–34.0)
MCHC: 29.7 g/dL — AB (ref 30.0–36.0)
MCV: 88.5 fL (ref 78.0–100.0)
Platelets: 647 10*3/uL — ABNORMAL HIGH (ref 150–400)
RBC: 3.23 MIL/uL — AB (ref 3.87–5.11)
RDW: 18 % — ABNORMAL HIGH (ref 11.5–15.5)
WBC: 11.1 10*3/uL — ABNORMAL HIGH (ref 4.0–10.5)

## 2015-04-08 LAB — BASIC METABOLIC PANEL
Anion gap: 8 (ref 5–15)
BUN: 13 mg/dL (ref 6–20)
CALCIUM: 8.8 mg/dL — AB (ref 8.9–10.3)
CO2: 25 mmol/L (ref 22–32)
Chloride: 109 mmol/L (ref 101–111)
Creatinine, Ser: 0.6 mg/dL (ref 0.44–1.00)
GFR calc Af Amer: >60 mL/min (ref >60)
GFR calc non Af Amer: >60 mL/min (ref >60)
Glucose, Bld: 140 mg/dL — ABNORMAL HIGH (ref 65–99)
Potassium: 4.5 mmol/L (ref 3.5–5.1)
SODIUM: 142 mmol/L (ref 135–145)

## 2015-04-08 MED ORDER — PREDNISONE 10 MG PO TABS: 10.0000 mg | ORAL_TABLET | Freq: Every day | ORAL | Status: DC

## 2015-04-08 MED ORDER — METRONIDAZOLE 500 MG PO TABS: 500.0000 mg | ORAL_TABLET | Freq: Three times a day (TID) | ORAL | Status: DC

## 2015-04-08 MED ORDER — OXYCODONE HCL 5 MG PO TABS
5.0000 mg | ORAL_TABLET | ORAL | Status: DC | PRN
  Administered 2015-04-08 (×2): 5 mg via ORAL
  Filled 2015-04-08 (×2): qty 1

## 2015-04-08 MED ORDER — LINEZOLID 600 MG PO TABS: 600.0000 mg | ORAL_TABLET | Freq: Two times a day (BID) | ORAL | Status: DC

## 2015-04-08 NOTE — Discharge Summary (Signed)
Physician Discharge Summary  Joanna Stephens:[id number] DOB: 1966-05-12 DOA: 04/05/2015  PCP: No PCP Per Patient  Admit date: 04/05/2015 Discharge date: 04/08/2015  Time spent: 45 minutes  Recommendations for Outpatient Follow-up:  -Will be discharged home today. -Arrangements have been made with case manager for patient to receive Zyvox and Flagyl free of charge. Also arrangements have been made for her to receive lab work weekly to monitor platelets while on Zyvox, Dr. Luan Pulling will follow lab work.   Discharge Diagnoses:  Principal Problem:   Necrotizing pneumonia Active Problems:   Protein-calorie malnutrition, severe   Discharge Condition: Stable  Filed Weights   04/05/15 1101  Weight: 43.545 kg (96 lb)    History of present illness:  This is a 49 year old lady who, 2 months ago, had dry cough associated with increasing dyspnea but no fever. She was seen in the emergency room and diagnosed with bronchitis and given oral antibiotics. During this time she is not improved significantly, has lost apparently 25 pounds in weight and returns because she had an accidental fall and injured her right arm. When she was seen in the emergency room today x-rays were done to look at the ribs. There was no rib fractures but there appeared to be consolidation in the right upper lobe compatible with pneumonia. She then underwent CT angiogram of the chest and this, surprisingly, shows air bronchogram and cystic air-filled spaces consistent with pneumonia with necrosis in the right lung. There appears to be a multifocal pneumonia pattern. Fortunately, she has not required any supplemental oxygen so far. She is now being admitted for further management.  Hospital Course:   Necrotizing pneumonia -Is improving on vancomycin and Zosyn while in the hospital. -Have to cover for staph as well as aspiration. -Discussed case via phone with Dr. Tommy Medal with infectious disease who recommended  discharge on Zyvox and Flagyl for 4 weeks. -She will have lab work weekly while on the Zyvox to monitor her platelet count and results will be monitored by Dr. Luan Pulling.  COPD with mild acute exacerbation -Will be discharged on a prednisone taper.  Tobacco abuse -Counseled on cessation.  Severe protein caloric malnutrition -Has met with dietitian this hospitalization.   Procedures:  None   Consultations:  Pulmonary, Dr. Luan Pulling  Discharge Instructions     Medication List    STOP taking these medications        azithromycin 250 MG tablet  Commonly known as:  ZITHROMAX     diclofenac 50 MG EC tablet  Commonly known as:  VOLTAREN     HYDROcodone-acetaminophen 5-325 MG per tablet  Commonly known as:  NORCO/VICODIN     meclizine 25 MG tablet  Commonly known as:  ANTIVERT      TAKE these medications        albuterol 108 (90 BASE) MCG/ACT inhaler  Commonly known as:  PROVENTIL HFA;VENTOLIN HFA  Inhale 2 puffs into the lungs every 6 (six) hours as needed for shortness of breath.     aspirin 325 MG tablet  Take 325 mg by mouth 2 (two) times daily as needed (pain).     linezolid 600 MG tablet  Commonly known as:  ZYVOX  Take 1 tablet (600 mg total) by mouth 2 (two) times daily.     metroNIDAZOLE 500 MG tablet  Commonly known as:  FLAGYL  Take 1 tablet (500 mg total) by mouth 3 (three) times daily.     predniSONE 10 MG tablet  Commonly  known as:  DELTASONE  Take 1 tablet (10 mg total) by mouth daily with breakfast. Take 6 tablets today and then decrease by 1 tablet daily until none are left.       Allergies  Allergen Reactions  . Ultracet [Tramadol-Acetaminophen] Hives and Nausea And Vomiting  . Ultram [Tramadol] Nausea And Vomiting      The results of significant diagnostics from this hospitalization (including imaging, microbiology, ancillary and laboratory) are listed below for reference.    Significant Diagnostic Studies: Dg Ribs Unilateral W/chest  Right  04/05/2015   CLINICAL DATA:  Right lateral rib pain. Fall this morning. Cough for 2 weeks. Smoker.  EXAM: RIGHT RIBS AND CHEST - 3+ VIEW  COMPARISON:  03/27/2015  FINDINGS: Increasing right upper lobe consolidation compatible with worsening pneumonia. Patchy right lower lobe opacity is similar to prior study. Left lung is clear. Heart is normal size. No acute bony abnormality. No visible rib fracture. No pneumothorax or effusion.  IMPRESSION: Worsening consolidation in the right upper lobe compatible with worsening pneumonia. Stable patchy right lower lobe opacity.  No visible rib fracture, pneumothorax or effusion.   Electronically Signed   By: Rolm Baptise M.D.   On: 04/05/2015 11:41   Ct Angio Chest Pe W/cm &/or Wo Cm  04/05/2015   CLINICAL DATA:  Rib pain, pneumonia  EXAM: CT ANGIOGRAPHY CHEST WITH CONTRAST  TECHNIQUE: Multidetector CT imaging of the chest was performed using the standard protocol during bolus administration of intravenous contrast. Multiplanar CT image reconstructions and MIPs were obtained to evaluate the vascular anatomy.  CONTRAST:  178m OMNIPAQUE IOHEXOL 350 MG/ML SOLN  COMPARISON:  06/30/2014 and chest x-ray today  FINDINGS: Images of the thoracic inlet are unremarkable. Central airways are patent. The study is of excellent technical quality. No pulmonary embolus is noted.  Sagittal images of the spine shows no acute fractures. Mild degenerative changes thoracic spine. Sagittal view of the sternum is unremarkable.  No acute rib fractures are identified. There is no scapular fracture.  As noted on today chest x-ray there is consolidation with air bronchogram cystic air filled small cavities in right upper lobe consistent necrotic pneumonia. Additional patchy nodular pneumonia noted in anterior segment of the right upper lobe. Again noted 3 mm nodule in anterior aspect of right lower lobe stable from prior exam. There is a nodule in axial image 137 in right middle lobe measures  5.2 mm. There is progression in size from prior exam when measures 3.7 mm. There is additional patchy infiltrate in right lower lobe see axial images 53 and 54. Small cystic bronchiectasis is noted in right lower lobe centrally.  Mild perihilar peribronchial thickening. No significant hilar or mediastinal adenopathy. Heart size within normal limits.  The visualized upper abdomen is unremarkable.  Review of the MIP images confirms the above findings.  IMPRESSION: 1. There is consolidation with air bronchogram and cystic air filled spaces consistent with pneumonia with necrosis. This is in lateral segment as noted on chest x-ray. Additional patchy pneumonia is noted in anterior segment. Patchy pneumonia is noted in right lower lobe anterior and laterally. Findings consistent with multifocal bronchopneumonia. Follow-up to resolution after treatment is recommended. 2. Progression in size of nodule in right middle lobe anteriorly measures 5 mm on the prior exam measures 3.7 mm. Stable 3 mm nodule in anterior aspect of the right lower lobe. Follow-up CT scan in 3 months is recommended to assure stability. 3. Bilateral mild perihilar peribronchial thickening. No significant mediastinal adenopathy. 4.  No pulmonary embolus.   Electronically Signed   By: Lahoma Crocker M.D.   On: 04/05/2015 15:39    Microbiology: Recent Results (from the past 240 hour(s))  Blood culture (routine x 2)     Status: None (Preliminary result)   Collection Time: 04/05/15  4:02 PM  Result Value Ref Range Status   Specimen Description BLOOD LEFT HAND  Final   Special Requests BOTTLES DRAWN AEROBIC AND ANAEROBIC 10CC  Final   Culture NO GROWTH 3 DAYS  Final   Report Status PENDING  Incomplete  Blood culture (routine x 2)     Status: None (Preliminary result)   Collection Time: 04/05/15  4:10 PM  Result Value Ref Range Status   Specimen Description BLOOD RIGHT HAND  Final   Special Requests BOTTLES DRAWN AEROBIC AND ANAEROBIC 6CC  Final    Culture NO GROWTH 3 DAYS  Final   Report Status PENDING  Incomplete     Labs: Basic Metabolic Panel:  Recent Labs Lab 04/05/15 1235 04/06/15 0555 04/07/15 0621 04/08/15 0606  NA 144 143 141 142  K 3.4* 3.3* 3.5 4.5  CL 112* 111 111 109  CO2 '24 24 23 25  ' GLUCOSE 94 76 100* 140*  BUN '9 6 10 13  ' CREATININE 0.57 0.55 0.68 0.60  CALCIUM 8.5* 8.4* 8.2* 8.8*   Liver Function Tests:  Recent Labs Lab 04/06/15 0555  AST 18  ALT 16  ALKPHOS 138*  BILITOT 0.2*  PROT 6.2*  ALBUMIN 2.2*   No results for input(s): LIPASE, AMYLASE in the last 168 hours. No results for input(s): AMMONIA in the last 168 hours. CBC:  Recent Labs Lab 04/05/15 1235 04/06/15 0555 04/08/15 0606  WBC 12.0* 12.2* 11.1*  NEUTROABS 9.3*  --   --   HGB 8.5* 8.9* 8.5*  HCT 26.7* 28.7* 28.6*  MCV 85.9 87.0 88.5  PLT 504* 537* 647*   Cardiac Enzymes:  Recent Labs Lab 04/05/15 1235  TROPONINI <0.03   BNP: BNP (last 3 results) No results for input(s): BNP in the last 8760 hours.  ProBNP (last 3 results)  Recent Labs  05/16/14 1327 09/29/14 1330  PROBNP 53.1 82.7    CBG: No results for input(s): GLUCAP in the last 168 hours.     SignedLelon Frohlich  [facility] Pager: [phone] 04/08/2015, 3:01 PM

## 2015-04-08 NOTE — Progress Notes (Signed)
Subjective: She says she feels better. She was able to walk in the hall yesterday. She is still coughing mostly nonproductively.  Objective: Vital signs in last 24 hours: Temp:  [97.6 F (36.4 C)-98.4 F (36.9 C)] 98.2 F (36.8 C) (05/19 0555) Pulse Rate:  [60-80] 60 (05/19 0555) Resp:  [20] 20 (05/19 0555) BP: (94-102)/(57-67) 102/64 mmHg (05/19 0555) SpO2:  [96 %-100 %] 96 % (05/19 0710) Weight change:     Intake/Output from previous day: 05/18 0701 - 05/19 0700 In: 1270 [P.O.:920; IV Piggyback:350] Out: 1700 [Urine:1700]  PHYSICAL EXAM General appearance: alert, cooperative, mild distress and Very thin Resp: rhonchi bilaterally Cardio: regular rate and rhythm, S1, S2 normal, no murmur, click, rub or gallop GI: soft, non-tender; bowel sounds normal; no masses,  no organomegaly Extremities: extremities normal, atraumatic, no cyanosis or edema  Lab Results:  Results for orders placed or performed during the hospital encounter of 04/05/15 (from the past 48 hour(s))  Basic metabolic panel     Status: Abnormal   Collection Time: 04/07/15  6:21 AM  Result Value Ref Range   Sodium 141 135 - 145 mmol/L   Potassium 3.5 3.5 - 5.1 mmol/L   Chloride 111 101 - 111 mmol/L   CO2 23 22 - 32 mmol/L   Glucose, Bld 100 (H) 65 - 99 mg/dL   BUN 10 6 - 20 mg/dL   Creatinine, Ser 0.68 0.44 - 1.00 mg/dL   Calcium 8.2 (L) 8.9 - 10.3 mg/dL   GFR calc non Af Amer >60 >60 mL/min   GFR calc Af Amer >60 >60 mL/min    Comment: (NOTE) The eGFR has been calculated using the CKD EPI equation. This calculation has not been validated in all clinical situations. eGFR's persistently <60 mL/min signify possible Chronic Kidney Disease.    Anion gap 7 5 - 15  Basic metabolic panel     Status: Abnormal   Collection Time: 04/08/15  6:06 AM  Result Value Ref Range   Sodium 142 135 - 145 mmol/L   Potassium 4.5 3.5 - 5.1 mmol/L    Comment: DELTA CHECK NOTED   Chloride 109 101 - 111 mmol/L   CO2 25 22 -  32 mmol/L   Glucose, Bld 140 (H) 65 - 99 mg/dL   BUN 13 6 - 20 mg/dL   Creatinine, Ser 0.60 0.44 - 1.00 mg/dL   Calcium 8.8 (L) 8.9 - 10.3 mg/dL   GFR calc non Af Amer >60 >60 mL/min   GFR calc Af Amer >60 >60 mL/min    Comment: (NOTE) The eGFR has been calculated using the CKD EPI equation. This calculation has not been validated in all clinical situations. eGFR's persistently <60 mL/min signify possible Chronic Kidney Disease.    Anion gap 8 5 - 15  CBC     Status: Abnormal   Collection Time: 04/08/15  6:06 AM  Result Value Ref Range   WBC 11.1 (H) 4.0 - 10.5 K/uL   RBC 3.23 (L) 3.87 - 5.11 MIL/uL   Hemoglobin 8.5 (L) 12.0 - 15.0 g/dL   HCT 28.6 (L) 36.0 - 46.0 %   MCV 88.5 78.0 - 100.0 fL   MCH 26.3 26.0 - 34.0 pg   MCHC 29.7 (L) 30.0 - 36.0 g/dL   RDW 18.0 (H) 11.5 - 15.5 %   Platelets 647 (H) 150 - 400 K/uL    ABGS No results for input(s): PHART, PO2ART, TCO2, HCO3 in the last 72 hours.  Invalid input(s): PCO2 CULTURES  Recent Results (from the past 240 hour(s))  Blood culture (routine x 2)     Status: None (Preliminary result)   Collection Time: 04/05/15  4:02 PM  Result Value Ref Range Status   Specimen Description BLOOD LEFT HAND  Final   Special Requests BOTTLES DRAWN AEROBIC AND ANAEROBIC 10CC  Final   Culture NO GROWTH 2 DAYS  Final   Report Status PENDING  Incomplete  Blood culture (routine x 2)     Status: None (Preliminary result)   Collection Time: 04/05/15  4:10 PM  Result Value Ref Range Status   Specimen Description BLOOD RIGHT HAND  Final   Special Requests BOTTLES DRAWN AEROBIC AND ANAEROBIC 6CC  Final   Culture NO GROWTH 2 DAYS  Final   Report Status PENDING  Incomplete   Studies/Results: No results found.  Medications:  Prior to Admission:  Prescriptions prior to admission  Medication Sig Dispense Refill Last Dose  . albuterol (PROVENTIL HFA;VENTOLIN HFA) 108 (90 BASE) MCG/ACT inhaler Inhale 2 puffs into the lungs every 6 (six) hours as  needed for shortness of breath.   Past Week at Unknown time  . aspirin 325 MG tablet Take 325 mg by mouth 2 (two) times daily as needed (pain).   04/05/2015 at Unknown time  . azithromycin (ZITHROMAX) 250 MG tablet Take one tablet a day,  Start on monday (Patient not taking: Reported on 04/05/2015) 4 tablet 0 Not Taking at Unknown time  . diclofenac (VOLTAREN) 50 MG EC tablet Take 1 tablet (50 mg total) by mouth 2 (two) times daily. (Patient not taking: Reported on 04/05/2015) 15 tablet 0 Not Taking at Unknown time  . HYDROcodone-acetaminophen (NORCO/VICODIN) 5-325 MG per tablet Take 1 tablet by mouth every 6 (six) hours as needed for moderate pain. (Patient not taking: Reported on 04/05/2015) 20 tablet 0 Not Taking at Unknown time  . meclizine (ANTIVERT) 25 MG tablet Take 1 tablet (25 mg total) by mouth 3 (three) times daily as needed for dizziness. (Patient not taking: Reported on 04/05/2015) 15 tablet 0 Not Taking at Unknown time   Scheduled: . albuterol  3 mL Inhalation QID  . feeding supplement (ENSURE ENLIVE)  237 mL Oral BID BM  . guaiFENesin  1,200 mg Oral BID  . heparin  5,000 Units Subcutaneous 3 times per day  . methylPREDNISolone (SOLU-MEDROL) injection  40 mg Intravenous Q12H  . ondansetron (ZOFRAN) IV  4 mg Intravenous 4 times per day  . piperacillin-tazobactam (ZOSYN)  IV  3.375 g Intravenous Q8H  . vancomycin  1,000 mg Intravenous Q24H   Continuous:  OEV:[id number], oxyCODONE, zolpidem  Assesment: She has what appears to be a necrotizing pneumonia. She is clearly improving. She is currently on Zosyn and vancomycin but plans are being made for her to be discharged on Zyvox and Flagyl. She will need help paying for the medications. She can be switched to steroids in a tapering course like a Medrol Dosepak when she is ready for discharge. Principal Problem:   Necrotizing pneumonia Active Problems:   Protein-calorie malnutrition, severe    Plan: Continue current treatments. Further  plans as above.    LOS: 3 days   Joanna Stephens L 04/08/2015, 8:03 AM

## 2015-04-08 NOTE — Care Management Note (Signed)
Case Management Note  Patient Details  Name: Joanna Stephens MRN: [id number] Date of Birth: 03/28/1966  Subjective/Objective:                Action/Plan:   Expected Discharge Date:                  Expected Discharge Plan:  Home/Self Care  In-House Referral:  Financial Counselor  Discharge planning Services  [facility]  Post Acute Care Choice:  NA Choice offered to:  NA  DME Arranged:    DME Agency:     HH Arranged:    HH Agency:     Status of Service:  Completed, signed off  Medicare Important Message Given:    Date Medicare IM Given:    Medicare IM give by:    Date Additional Medicare IM Given:    Additional Medicare Important Message give by:     If discussed at Long Length of Stay Meetings, dates discussed:    Additional Comments: [facility] and also voucher for the zyvox from the drug company for the zyvox. ID clinic will call pt on Monday May 23 with follow up appt with ID MD. Dr. Juanetta GoslingHawkins has agreed to follow pts lab work while on the the zyvox. Pt has instructions on coming to lab and will need to fill out financial assistance paperwork for assistance with lab work fees. Pt also instructed on follow up with ID. Pt given information on [employer] and [employer]. Pt nurse also aware of above.  Arlyss QueenBlackwell, Abdulhamid Olgin Emajaguarowder, RN 04/08/2015, 2:38 PM

## 2015-04-08 NOTE — Progress Notes (Signed)
Patient complains of right shoulder pain. Oxy IR was given at 0636. Oxy IR scheduled for every 6 hours as needed. Dr. Ardyth HarpsHernandez notified. New order to change Oxy IR to every 4 hours as needed./

## 2015-04-10 LAB — CULTURE, BLOOD (ROUTINE X 2)
CULTURE: NO GROWTH
CULTURE: NO GROWTH

## 2015-05-11 ENCOUNTER — Ambulatory Visit: Payer: Self-pay | Admitting: Internal Medicine

## 2015-06-02 IMAGING — DX DG RIBS W/ CHEST 3+V*R*
3 series · 3 of 3 positions shown · non-contrast
Comparison: 03/27/2015

CLINICAL DATA: Right lateral rib pain. Fall this morning. Cough for
2 weeks. Smoker.

EXAM:
RIGHT RIBS AND CHEST - 3+ VIEW

[chest pa]
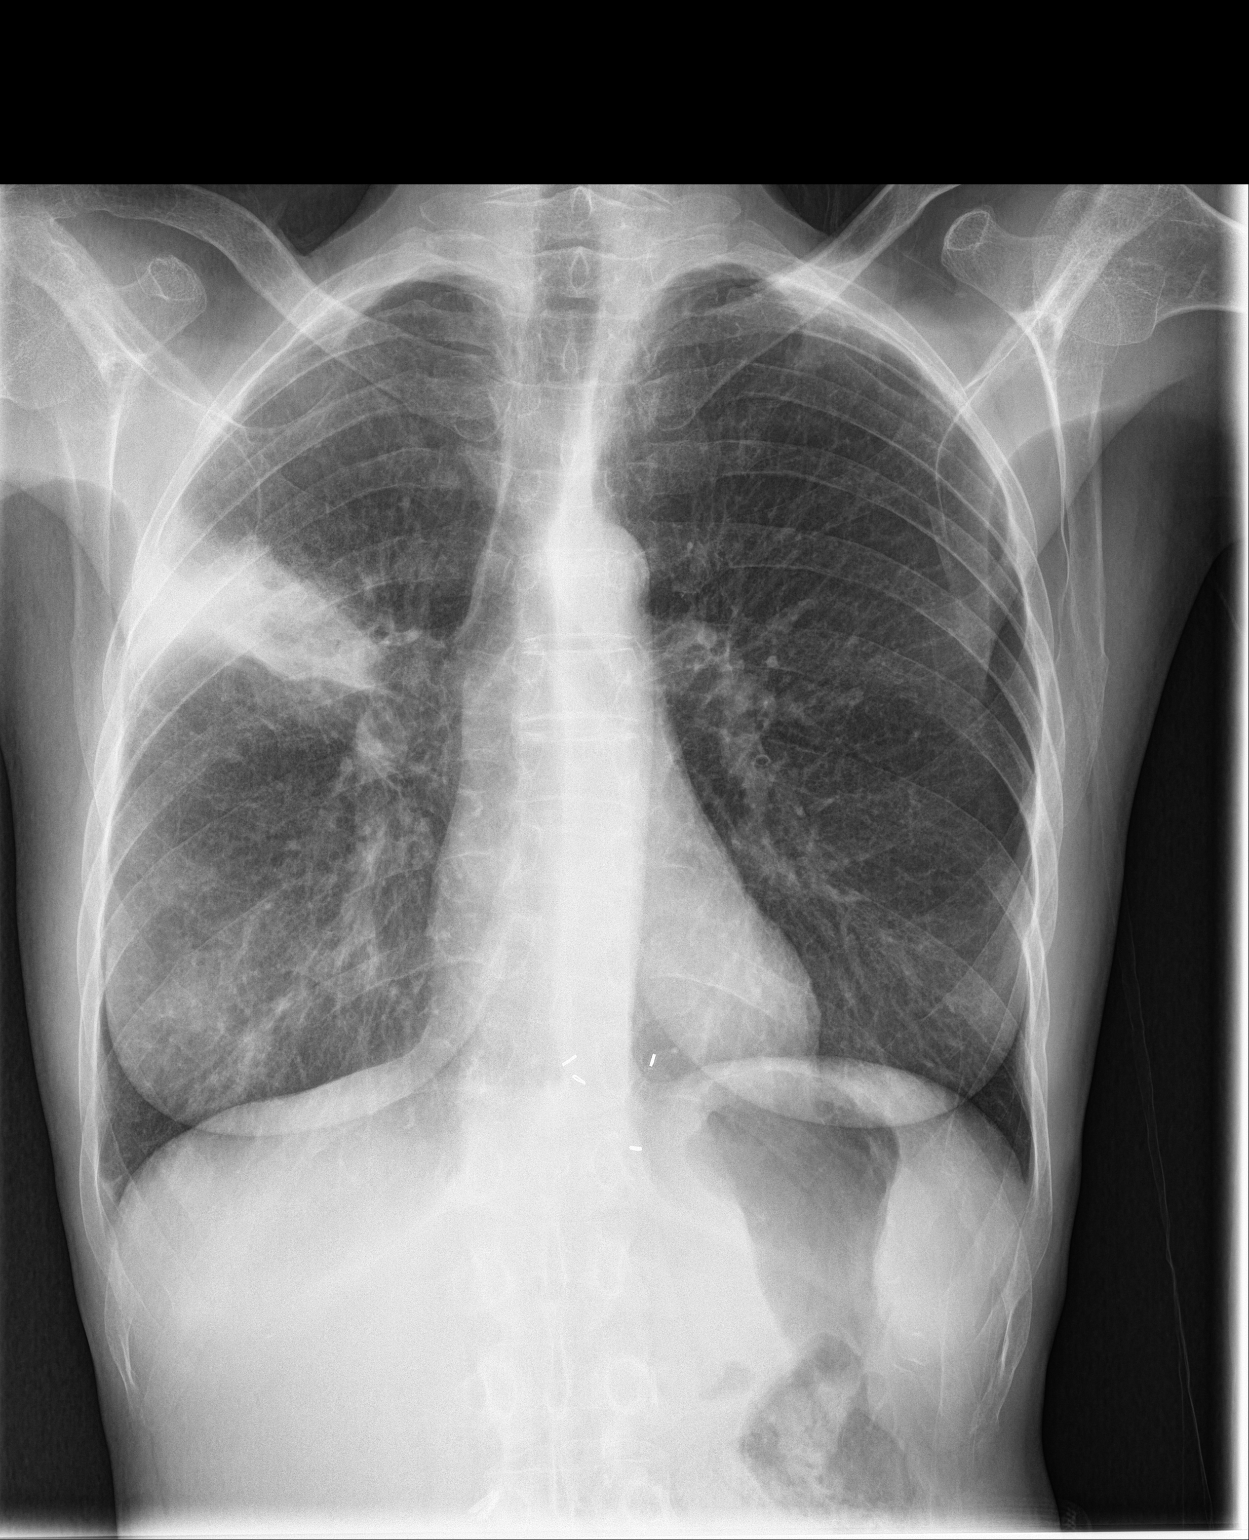

[rib pa]
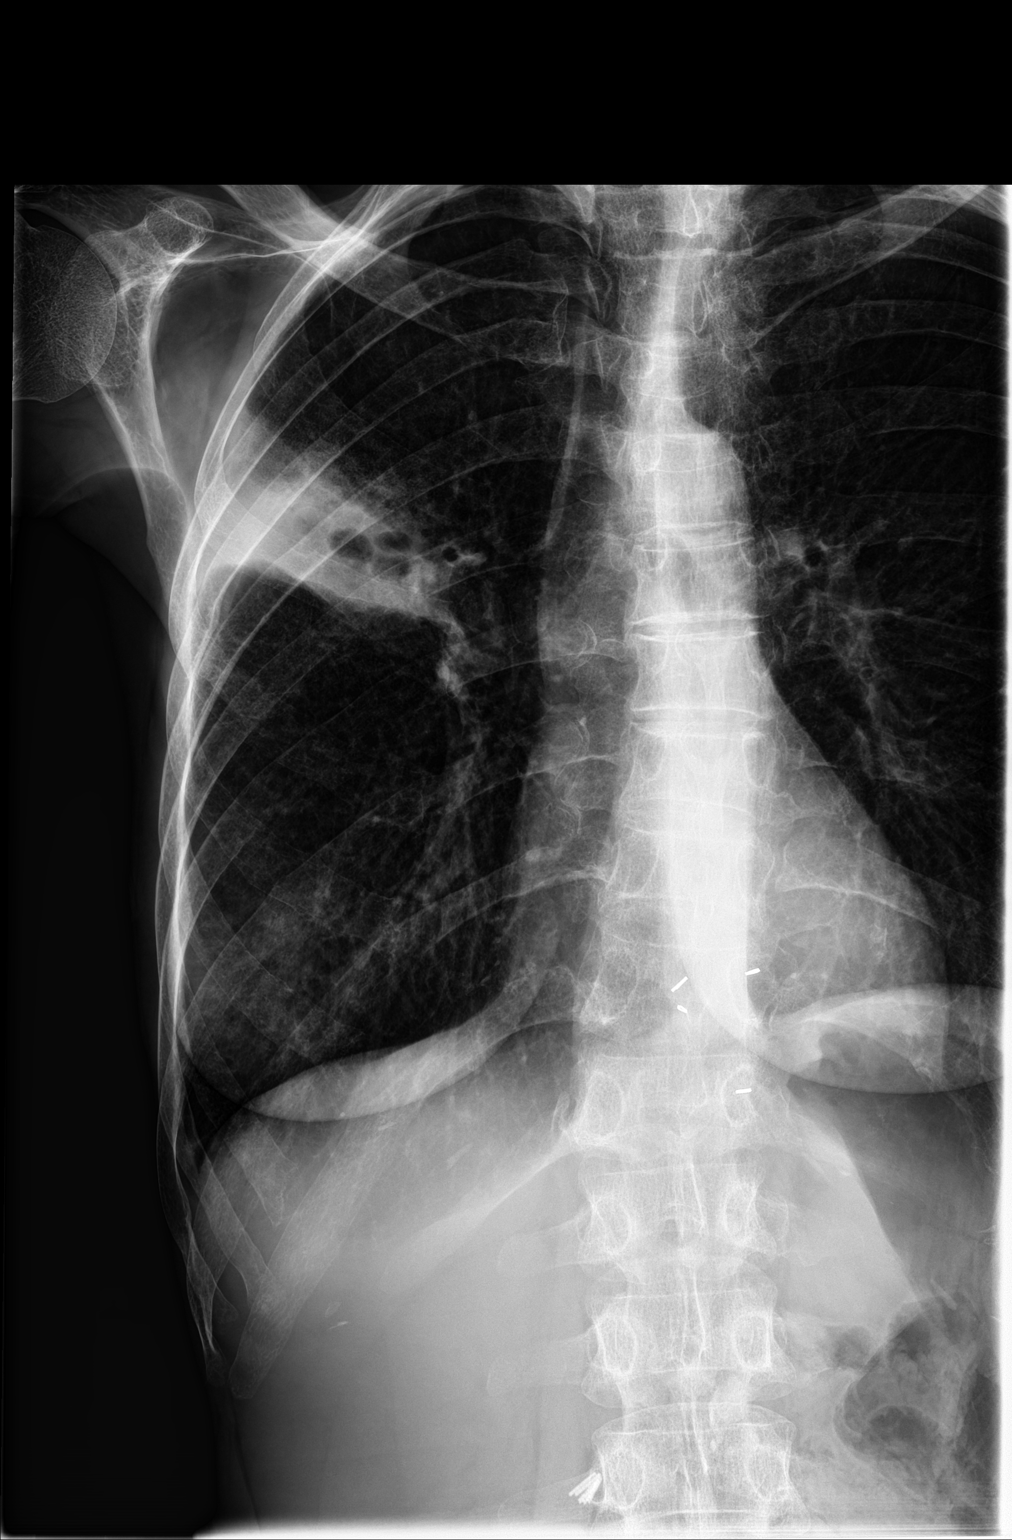

[rib obl]
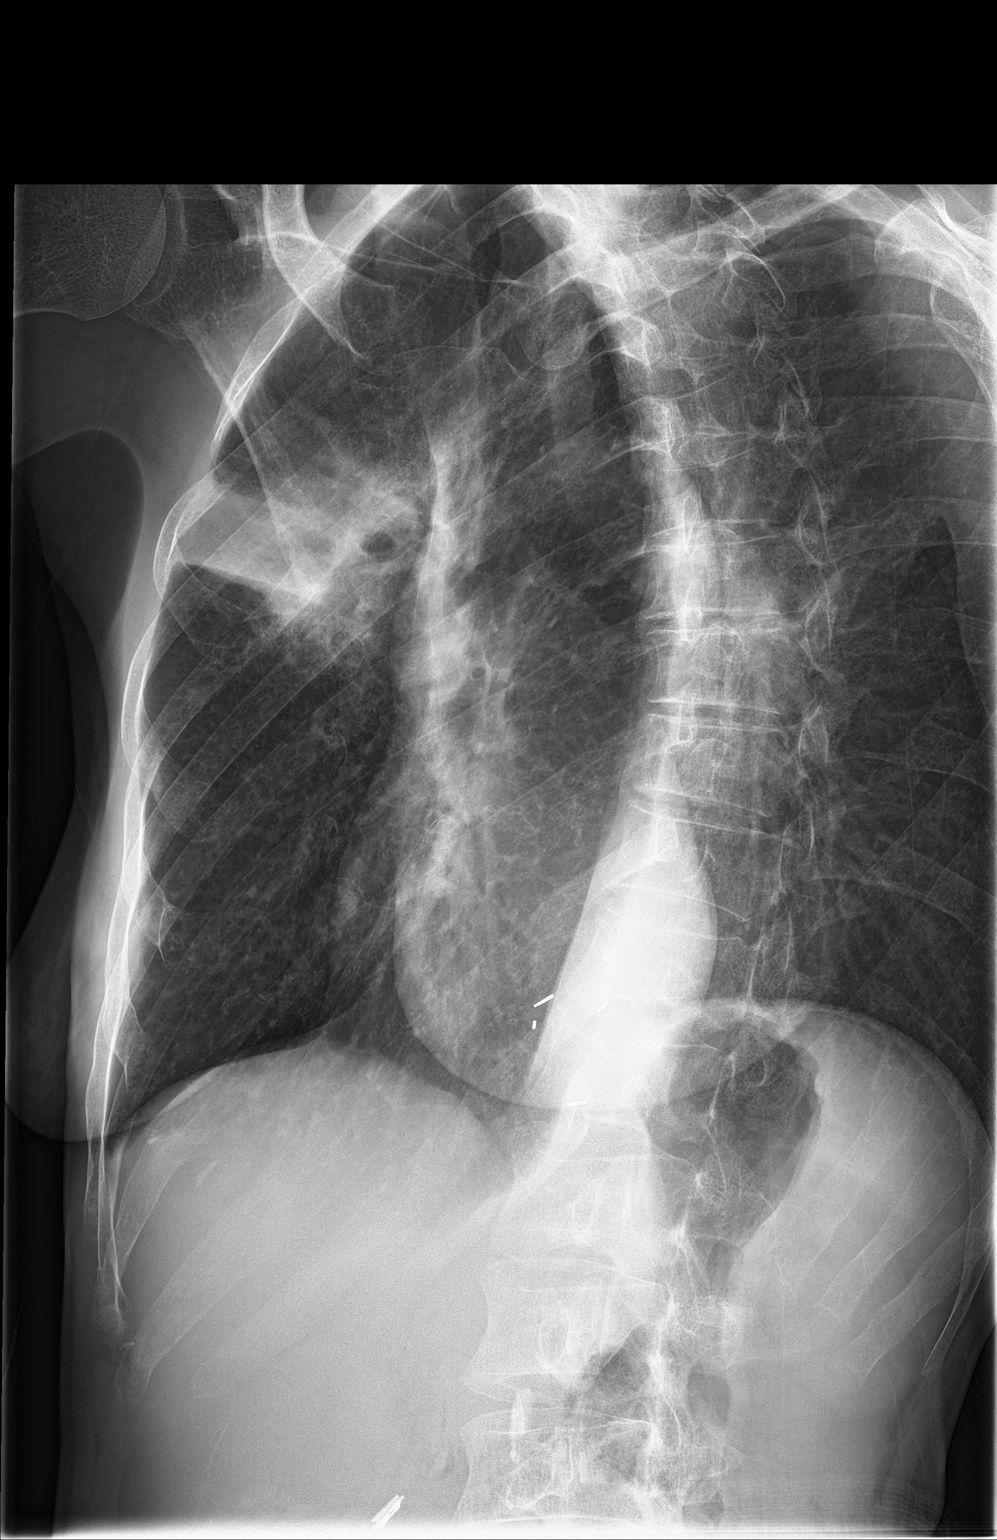

[3 of 3 positions shown; findings below may reference images not displayed]

FINDINGS: Increasing right upper lobe consolidation compatible with worsening
pneumonia. Patchy right lower lobe opacity is similar to prior
study. Left lung is clear. Heart is normal size. No acute bony
abnormality. No visible rib fracture. No pneumothorax or effusion.
IMPRESSION: Worsening consolidation in the right upper lobe compatible with
worsening pneumonia. Stable patchy right lower lobe opacity.

No visible rib fracture, pneumothorax or effusion.

## 2015-06-02 IMAGING — CT CT ANGIO CHEST
1 of 6 series · 5 of 36 positions shown · IV contrast (omnipaque)
Comparison: 06/30/2014 and chest x-ray today

CLINICAL DATA: Rib pain, pneumonia

EXAM:
CT ANGIOGRAPHY CHEST WITH CONTRAST
TECHNIQUE: Multidetector CT imaging of the chest was performed using the
standard protocol during bolus administration of intravenous
contrast. Multiplanar CT image reconstructions and MIPs were
obtained to evaluate the vascular anatomy.
CONTRAST:  100mL OMNIPAQUE IOHEXOL 350 MG/ML SOLN

[Series 6: pe 3.0 b40f · axial · 0.52mm/px · z∈[-241,-73]mm · 5 of 84 slices shown]
[im 14/84  lung]
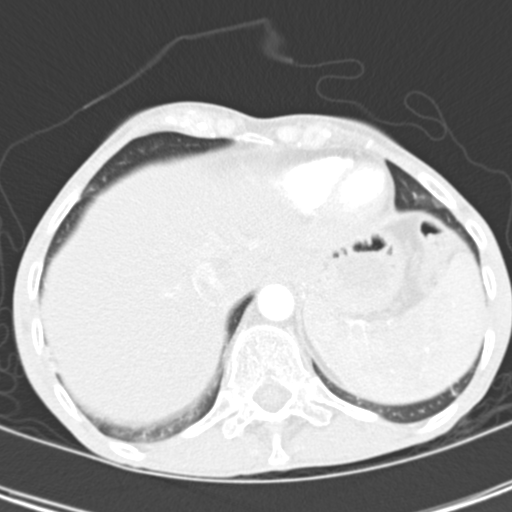
[im 28/84  mediastinal]
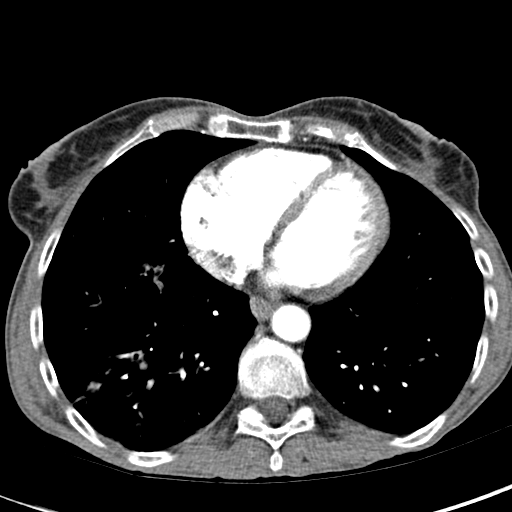
[im 42/84  lung]
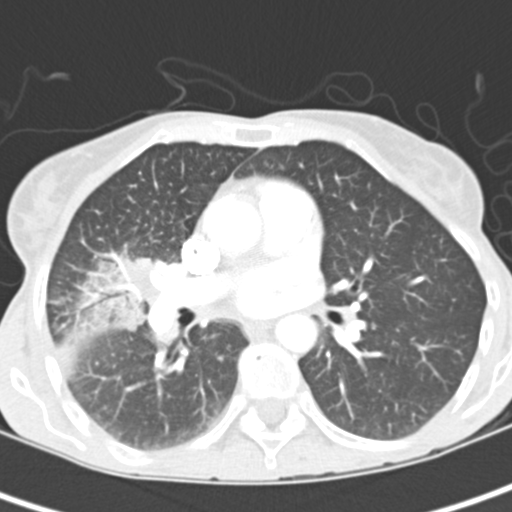
[im 56/84  mediastinal]
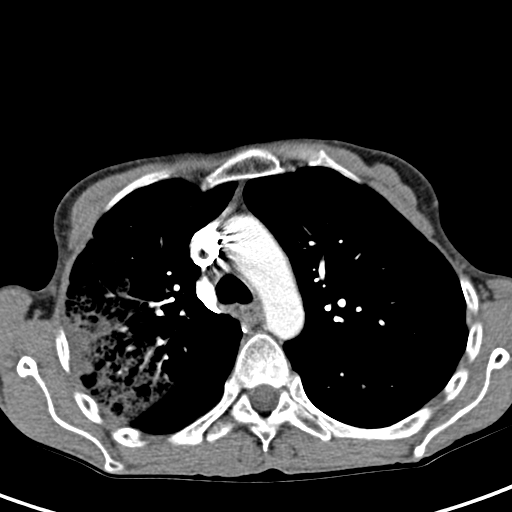
[im 70/84  lung]
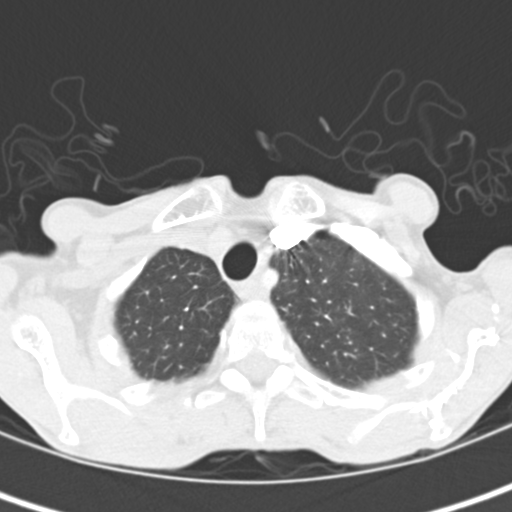

[5 of 36 positions shown; findings below may reference images not displayed]

FINDINGS: Images of the thoracic inlet are unremarkable. Central airways are
patent. The study is of excellent technical quality. No pulmonary
embolus is noted.

Sagittal images of the spine shows no acute fractures. Mild
degenerative changes thoracic spine. Sagittal view of the sternum is
unremarkable.

No acute rib fractures are identified. There is no scapular
fracture.

As noted on today chest x-ray there is consolidation with air
bronchogram cystic air filled small cavities in right upper lobe
consistent necrotic pneumonia. Additional patchy nodular pneumonia
noted in anterior segment of the right upper lobe. Again noted 3 mm
nodule in anterior aspect of right lower lobe stable from prior
exam. There is a nodule in axial image 137 in right middle lobe
measures 5.2 mm. There is progression in size from prior exam when
measures 3.7 mm. There is additional patchy infiltrate in right
lower lobe see axial images 53 and 54. Small cystic bronchiectasis
is noted in right lower lobe centrally.

Mild perihilar peribronchial thickening. No significant hilar or
mediastinal adenopathy. Heart size within normal limits.

The visualized upper abdomen is unremarkable.

Review of the MIP images confirms the above findings.
IMPRESSION: 1. There is consolidation with air bronchogram and cystic air filled
spaces consistent with pneumonia with necrosis. This is in lateral
segment as noted on chest x-ray. Additional patchy pneumonia is
noted in anterior segment. Patchy pneumonia is noted in right lower
lobe anterior and laterally. Findings consistent with multifocal
bronchopneumonia. Follow-up to resolution after treatment is
recommended.
2. Progression in size of nodule in right middle lobe anteriorly
measures 5 mm on the prior exam measures 3.7 mm. Stable 3 mm nodule
in anterior aspect of the right lower lobe. Follow-up CT scan in 3
months is recommended to assure stability.
3. Bilateral mild perihilar peribronchial thickening. No significant
mediastinal adenopathy.
4. No pulmonary embolus.

## 2015-08-01 ENCOUNTER — Emergency Department (HOSPITAL_COMMUNITY): Payer: Self-pay

## 2015-08-01 ENCOUNTER — Emergency Department (HOSPITAL_COMMUNITY)
Admission: EM | Admit: 2015-08-01 | Discharge: 2015-08-01 | Disposition: A | Discharge status: Home / Self Care | Payer: Self-pay | Attending: Emergency Medicine | Admitting: Emergency Medicine

## 2015-08-01 ENCOUNTER — Encounter (HOSPITAL_COMMUNITY): Payer: Self-pay | Admitting: Emergency Medicine

## 2015-08-01 DIAGNOSIS — Z8709 Personal history of other diseases of the respiratory system: Secondary | ICD-10-CM | POA: Insufficient documentation

## 2015-08-01 DIAGNOSIS — Z720 Tobacco use: Secondary | ICD-10-CM | POA: Insufficient documentation

## 2015-08-01 DIAGNOSIS — Z79899 Other long term (current) drug therapy: Secondary | ICD-10-CM | POA: Insufficient documentation

## 2015-08-01 DIAGNOSIS — W010XXA Fall on same level from slipping, tripping and stumbling without subsequent striking against object, initial encounter: Secondary | ICD-10-CM | POA: Insufficient documentation

## 2015-08-01 DIAGNOSIS — Y9289 Other specified places as the place of occurrence of the external cause: Secondary | ICD-10-CM | POA: Insufficient documentation

## 2015-08-01 DIAGNOSIS — S199XXA Unspecified injury of neck, initial encounter: Secondary | ICD-10-CM | POA: Insufficient documentation

## 2015-08-01 DIAGNOSIS — S022XXA Fracture of nasal bones, initial encounter for closed fracture: Secondary | ICD-10-CM | POA: Insufficient documentation

## 2015-08-01 DIAGNOSIS — Z862 Personal history of diseases of the blood and blood-forming organs and certain disorders involving the immune mechanism: Secondary | ICD-10-CM | POA: Insufficient documentation

## 2015-08-01 DIAGNOSIS — Z7982 Long term (current) use of aspirin: Secondary | ICD-10-CM | POA: Insufficient documentation

## 2015-08-01 DIAGNOSIS — Y9389 Activity, other specified: Secondary | ICD-10-CM | POA: Insufficient documentation

## 2015-08-01 DIAGNOSIS — G8929 Other chronic pain: Secondary | ICD-10-CM | POA: Insufficient documentation

## 2015-08-01 DIAGNOSIS — Y998 Other external cause status: Secondary | ICD-10-CM | POA: Insufficient documentation

## 2015-08-01 DIAGNOSIS — S3992XA Unspecified injury of lower back, initial encounter: Secondary | ICD-10-CM | POA: Insufficient documentation

## 2015-08-01 DIAGNOSIS — Z8711 Personal history of peptic ulcer disease: Secondary | ICD-10-CM | POA: Insufficient documentation

## 2015-08-01 MED ORDER — HYDROCODONE-ACETAMINOPHEN 5-325 MG PO TABS: 2.0000 | ORAL_TABLET | ORAL | Status: AC | PRN

## 2015-08-01 MED ORDER — HYDROCODONE-ACETAMINOPHEN 5-325 MG PO TABS
2.0000 | ORAL_TABLET | Freq: Once | ORAL | Status: AC
  Administered 2015-08-01: 2 via ORAL
  Filled 2015-08-01: qty 2

## 2015-08-01 NOTE — ED Provider Notes (Signed)
CSN: [id number]     Arrival date & time 08/01/15  7829 History   First MD Initiated Contact with Patient 08/01/15 1000     Chief Complaint  Patient presents with  . Fall     (Consider location/radiation/quality/duration/timing/severity/associated sxs/prior Treatment) Patient is a 49 y.o. female presenting with fall. The history is provided by the patient. No language interpreter was used.  Fall This is a new problem. The current episode started today. The problem occurs constantly. The problem has been unchanged. Associated symptoms include neck pain. Pertinent negatives include no abdominal pain or sore throat. Nothing aggravates the symptoms. She has tried nothing for the symptoms. The treatment provided moderate relief.  Pt complains of back pain.   Pt thinks her nose is broken.  Pt complains under her eye.  Past Medical History  Diagnosis Date  . Bronchitis   . Peptic ulcer   . Chronic back pain   . Chronic chest wall pain   . Chronic cough   . Encounter for blood transfusion   . Anemia   . Vertigo    Past Surgical History  Procedure Laterality Date  . Abdominal hysterectomy    . Cholecystectomy    . Tubal ligation    . Ulcer surgery     History reviewed. No pertinent family history. Social History  Substance Use Topics  . Smoking status: Current Every Day Smoker -- 1.00 packs/day for 30 years    Types: Cigarettes  . Smokeless tobacco: Never Used  . Alcohol Use: No     Comment: Pt states she does not drink anymore.    OB History    Gravida Para Term Preterm AB TAB SAB Ectopic Multiple Living   Review of Systems  HENT: Positive for facial swelling. Negative for sore throat.   Gastrointestinal: Negative for abdominal pain.  Musculoskeletal: Positive for back pain and neck pain.  All other systems reviewed and are negative.     Allergies  Ultracet and Ultram  Home Medications   Prior to Admission medications   Medication Sig Start  Date End Date Taking? Authorizing Provider  albuterol (PROVENTIL HFA;VENTOLIN HFA) 108 (90 BASE) MCG/ACT inhaler Inhale 2 puffs into the lungs every 6 (six) hours as needed for shortness of breath.   Yes Historical Provider, MD  aspirin 325 MG tablet Take 325 mg by mouth 2 (two) times daily as needed (pain).   Yes Historical Provider, MD   BP 128/87 mmHg  Pulse 104  Temp(Src) 98.4 F (36.9 C) (Oral)  Resp 18  Ht  (1.6 m)  Wt 105 lb (47.628 kg)  BMI 18.60 kg/m2  SpO2 96% Physical Exam  Constitutional: She appears well-developed and well-nourished.  HENT:  Head: Normocephalic.  Right Ear: External ear normal.  Left Ear: External ear normal.  Mouth/Throat: Oropharynx is clear and moist.  Swollen bruised nose,  Tender under bilat eyes.   Eyes: Conjunctivae and EOM are normal. Pupils are equal, round, and reactive to light.  Neck: Normal range of motion.  Cardiovascular: Normal rate and normal heart sounds.   Pulmonary/Chest: Effort normal.  Abdominal: Soft.  Musculoskeletal: Normal range of motion.  Neurological: She is alert.  Skin: Skin is warm.  Psychiatric: She has a normal mood and affect.  Nursing note and vitals reviewed.   ED Course  Procedures (including critical care time) Labs Review Labs Reviewed - No data to display  Imaging Review  Dg Cervical Spine Complete  08/01/2015   CLINICAL DATA:  Tripped over a dog toy this morning. Fall. Headache and neck pain. Initial encounter.  EXAM: CERVICAL SPINE  4+ VIEWS  COMPARISON:  06/15/2012  FINDINGS: Lateral radiograph is slightly degraded by obliquity as well as overlapping shoulder tissues which obscure the lower aspect of C7 and T1. Vertebral alignment is normal through C6-7, with C7-T1 incompletely evaluated. Prevertebral soft tissues are within normal limits. There is mild left-sided osseous neural foraminal narrowing at C3-4. No acute cervical spine fracture is identified. Visualized lung apices are clear.  IMPRESSION:  Slightly limited study as above. No acute osseous abnormality identified.   Electronically Signed   By: Sebastian Ache M.D.   On: 08/01/2015 11:31   Dg Lumbar Spine Complete  08/01/2015   CLINICAL DATA:  Patient tripped over dog toy.  Lower back pain.  EXAM: LUMBAR SPINE - COMPLETE 4+ VIEW  COMPARISON:  07/23/2015  FINDINGS: The bones appear osteopenic. There is no acute fracture or subluxation identified. The alignment of the lumbar spine appears normal. The vertebral body heights and disc spaces are well preserved.  IMPRESSION: 1. Osteopenia. 2. No acute findings.   Electronically Signed   By: Signa Kell M.D.   On: 08/01/2015 11:28   Ct Maxillofacial Wo Cm  08/01/2015   CLINICAL DATA:  Patient fell striking face. This occurred earlier today. Face pain.  EXAM: CT MAXILLOFACIAL WITHOUT CONTRAST  TECHNIQUE: Multidetector CT imaging of the maxillofacial structures was performed. Multiplanar CT image reconstructions were also generated. A small metallic BB was placed on the right temple in order to reliably differentiate right from left.  COMPARISON:  None.  FINDINGS: Comminuted BILATERAL nasal bone fractures. No significant sinus air-fluid level. Nasal septal deviation LEFT to RIGHT appears non acute. There is superficial nasal soft tissue swelling, with a possible tiny foreign body associated with laceration on the LEFT. See image 46 series 3.  RIGHT middle and inferior turbinate hypertrophy. No orbital blowout injury. Negative visualized intracranial compartment. The patient is edentulous. 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 No temporal bone abnormality. Unremarkable nasopharynx.  IMPRESSION: Comminuted BILATERAL nasal bone fractures. No other significant facial fracture is evident.  Soft tissue swelling with left-sided nasal laceration and foreign body.   Electronically Signed   By: Elsie Stain M.D.   On: 08/01/2015 11:24   I have personally reviewed and evaluated these  images and lab results as part of my medical decision-making.   EKG Interpretation None      MDM   Final diagnoses:  Nasal fracture, closed, initial encounter    Hydrocodone Follow up with Dr. Leta Baptist avs    Elson Areas, PA-C 08/01/15 1617  Raeford Razor, MD 08/12/15 [phone]

## 2015-08-01 NOTE — ED Notes (Signed)
Pt reports woke up this am got out of bed and tripped over a dog toy. Pt reports headache,neck pain, lower back pain. Abrasion noted to bridge of nose. Minimal bleeding. Small contusions noted to left side of forehead and under right eye. Pt alert and oriented. Airway patent.

## 2015-08-01 NOTE — Discharge Instructions (Signed)
Nasal Fracture A nasal fracture is a break or crack in the bones of the nose. A minor break usually heals in a month. You often will receive black eyes from a nasal fracture. This is not a cause for concern. The black eyes will go away over 1 to 2 weeks.  DIAGNOSIS  Your caregiver may want to examine you if you are concerned about a fracture of the nose. X-rays of the nose may not show a nasal fracture even when one is present. Sometimes your caregiver must wait 1 to 5 days after the injury to re-check the nose for alignment and to take additional X-rays. Sometimes the caregiver must wait until the swelling has gone down. TREATMENT Minor fractures that have caused no deformity often do not require treatment. More serious fractures where bones are displaced may require surgery. This will take place after the swelling is gone. Surgery will stabilize and align the fracture. HOME CARE INSTRUCTIONS   Put ice on the injured area.  Put ice in a plastic bag.  Place a towel between your skin and the bag.  Leave the ice on for 15-20 minutes, 03-04 times a day.  Take medications as directed by your caregiver.  Only take over-the-counter or prescription medicines for pain, discomfort, or fever as directed by your caregiver.  If your nose starts bleeding, squeeze the soft parts of the nose against the center wall while you are sitting in an upright position for 10 minutes.  Contact sports should be avoided for at least 3 to 4 weeks or as directed by your caregiver. SEEK MEDICAL CARE IF:  Your pain increases or becomes severe.  You continue to have nosebleeds.  The shape of your nose does not return to normal within 5 days.  You have pus draining from the nose. SEEK IMMEDIATE MEDICAL CARE IF:   You have bleeding from your nose that does not stop after 20 minutes of pinching the nostrils closed and keeping ice on the nose.  You have clear fluid draining from your nose.  You notice a grape-like  swelling on the dividing wall between the nostrils (septum). This is a collection of blood (hematoma) that must be drained to help prevent infection.  You have difficulty moving your eyes.  You have recurrent vomiting. Document Released: 11/03/2000 Document Revised: 01/29/2012 Document Reviewed: 02/20/2011 [employer] Patient Information 2015 [employer]. This information is not intended to replace advice given to you by your health care provider. Make sure you discuss any questions you have with your health care provider.  

## 2016-01-27 ENCOUNTER — Emergency Department (HOSPITAL_COMMUNITY): Admission: EM | Admit: 2016-01-27 | Discharge: 2016-01-27 | Discharge status: Absent without leave | Payer: MEDICAID

## 2016-03-20 DEATH — deceased
